# Patient Record
Sex: Female | Born: 1970 | Race: Black or African American | Hispanic: No | Marital: Single | Smoking: Never smoker
Health system: Southern US, Community
[De-identification: ages and names within clinical notes are randomized; demographics above are authoritative.]

## PROBLEM LIST (undated history)

## (undated) DIAGNOSIS — I10 Essential (primary) hypertension: Secondary | ICD-10-CM

## (undated) DIAGNOSIS — L309 Dermatitis, unspecified: Secondary | ICD-10-CM

## (undated) DIAGNOSIS — J302 Other seasonal allergic rhinitis: Secondary | ICD-10-CM

## (undated) DIAGNOSIS — M79606 Pain in leg, unspecified: Secondary | ICD-10-CM

## (undated) DIAGNOSIS — K219 Gastro-esophageal reflux disease without esophagitis: Secondary | ICD-10-CM

## (undated) DIAGNOSIS — G44229 Chronic tension-type headache, not intractable: Secondary | ICD-10-CM

## (undated) HISTORY — DX: Other seasonal allergic rhinitis: J30.2

## (undated) HISTORY — DX: Pain in leg, unspecified: M79.606

## (undated) HISTORY — DX: Gastro-esophageal reflux disease without esophagitis: K21.9

## (undated) HISTORY — PX: TUBAL LIGATION: SHX77

## (undated) HISTORY — PX: BLADDER SURGERY: SHX569

## (undated) HISTORY — DX: Dermatitis, unspecified: L30.9

## (undated) HISTORY — DX: Essential (primary) hypertension: I10

## (undated) HISTORY — DX: Chronic tension-type headache, not intractable: G44.229

---

## 2003-07-16 ENCOUNTER — Inpatient Hospital Stay (HOSPITAL_COMMUNITY): Admission: AD | Admit: 2003-07-16 | Discharge: 2003-07-16 | Payer: Self-pay | Admitting: Obstetrics & Gynecology

## 2004-04-14 ENCOUNTER — Emergency Department: Payer: Self-pay | Admitting: Internal Medicine

## 2004-06-14 ENCOUNTER — Emergency Department: Payer: Self-pay | Admitting: Emergency Medicine

## 2005-06-08 ENCOUNTER — Emergency Department: Payer: Self-pay | Admitting: Emergency Medicine

## 2007-05-20 ENCOUNTER — Emergency Department: Payer: Self-pay | Admitting: Emergency Medicine

## 2010-05-10 ENCOUNTER — Ambulatory Visit: Payer: Self-pay | Admitting: Unknown Physician Specialty

## 2010-05-18 ENCOUNTER — Ambulatory Visit: Payer: Self-pay | Admitting: Unknown Physician Specialty

## 2010-06-01 ENCOUNTER — Ambulatory Visit: Payer: Self-pay | Admitting: Unknown Physician Specialty

## 2011-04-04 ENCOUNTER — Ambulatory Visit: Payer: Self-pay | Admitting: Urology

## 2012-04-04 ENCOUNTER — Ambulatory Visit: Payer: Self-pay | Admitting: Family Medicine

## 2012-05-01 ENCOUNTER — Ambulatory Visit: Payer: Self-pay | Admitting: Otolaryngology

## 2013-04-30 ENCOUNTER — Other Ambulatory Visit (HOSPITAL_COMMUNITY): Payer: Self-pay | Admitting: Orthopedic Surgery

## 2013-04-30 DIAGNOSIS — M545 Low back pain, unspecified: Secondary | ICD-10-CM

## 2013-05-02 ENCOUNTER — Ambulatory Visit (HOSPITAL_COMMUNITY)
Admission: RE | Admit: 2013-05-02 | Discharge: 2013-05-02 | Disposition: A | Discharge status: Home / Self Care | Payer: BC Managed Care – PPO | Source: Ambulatory Visit | Attending: Orthopedic Surgery | Admitting: Orthopedic Surgery

## 2013-05-02 ENCOUNTER — Encounter (HOSPITAL_COMMUNITY): Payer: Self-pay

## 2013-05-02 DIAGNOSIS — M545 Low back pain, unspecified: Secondary | ICD-10-CM

## 2013-05-02 DIAGNOSIS — Q760 Spina bifida occulta: Secondary | ICD-10-CM | POA: Insufficient documentation

## 2013-05-02 DIAGNOSIS — M47817 Spondylosis without myelopathy or radiculopathy, lumbosacral region: Secondary | ICD-10-CM | POA: Insufficient documentation

## 2013-05-13 ENCOUNTER — Ambulatory Visit: Payer: Self-pay | Admitting: Gastroenterology

## 2013-05-15 LAB — PATHOLOGY REPORT

## 2013-05-20 ENCOUNTER — Ambulatory Visit: Payer: Self-pay | Admitting: Gastroenterology

## 2013-07-17 DIAGNOSIS — R350 Frequency of micturition: Secondary | ICD-10-CM | POA: Insufficient documentation

## 2013-07-17 DIAGNOSIS — R339 Retention of urine, unspecified: Secondary | ICD-10-CM | POA: Insufficient documentation

## 2013-07-17 DIAGNOSIS — N3020 Other chronic cystitis without hematuria: Secondary | ICD-10-CM | POA: Insufficient documentation

## 2013-09-02 ENCOUNTER — Ambulatory Visit: Payer: Self-pay | Admitting: Gastroenterology

## 2013-09-11 DIAGNOSIS — K219 Gastro-esophageal reflux disease without esophagitis: Secondary | ICD-10-CM | POA: Insufficient documentation

## 2014-01-08 DIAGNOSIS — G44229 Chronic tension-type headache, not intractable: Secondary | ICD-10-CM | POA: Insufficient documentation

## 2014-01-08 DIAGNOSIS — M79606 Pain in leg, unspecified: Secondary | ICD-10-CM | POA: Insufficient documentation

## 2014-02-04 DIAGNOSIS — R7302 Impaired glucose tolerance (oral): Secondary | ICD-10-CM | POA: Insufficient documentation

## 2014-06-26 ENCOUNTER — Ambulatory Visit: Payer: Self-pay | Admitting: Podiatry

## 2016-03-09 DIAGNOSIS — T85192A Other mechanical complication of implanted electronic neurostimulator (electrode) of spinal cord, initial encounter: Secondary | ICD-10-CM | POA: Insufficient documentation

## 2016-10-26 ENCOUNTER — Other Ambulatory Visit: Payer: Self-pay | Admitting: Orthopedic Surgery

## 2016-10-26 DIAGNOSIS — M4800 Spinal stenosis, site unspecified: Secondary | ICD-10-CM

## 2016-10-27 ENCOUNTER — Ambulatory Visit
Admission: RE | Admit: 2016-10-27 | Discharge: 2016-10-27 | Disposition: A | Discharge status: Home / Self Care | Payer: Managed Care, Other (non HMO) | Source: Ambulatory Visit | Attending: Orthopedic Surgery | Admitting: Orthopedic Surgery

## 2016-10-27 DIAGNOSIS — M4800 Spinal stenosis, site unspecified: Secondary | ICD-10-CM

## 2017-01-11 ENCOUNTER — Other Ambulatory Visit: Payer: Self-pay | Admitting: Orthopedic Surgery

## 2017-01-11 DIAGNOSIS — M533 Sacrococcygeal disorders, not elsewhere classified: Secondary | ICD-10-CM

## 2017-01-23 ENCOUNTER — Ambulatory Visit
Admission: RE | Admit: 2017-01-23 | Discharge: 2017-01-23 | Disposition: A | Discharge status: Home / Self Care | Payer: Managed Care, Other (non HMO) | Source: Ambulatory Visit | Attending: Orthopedic Surgery | Admitting: Orthopedic Surgery

## 2017-01-23 DIAGNOSIS — M533 Sacrococcygeal disorders, not elsewhere classified: Secondary | ICD-10-CM

## 2017-02-07 ENCOUNTER — Other Ambulatory Visit: Payer: Self-pay | Admitting: Orthopedic Surgery

## 2017-02-07 DIAGNOSIS — M533 Sacrococcygeal disorders, not elsewhere classified: Secondary | ICD-10-CM

## 2017-02-14 ENCOUNTER — Ambulatory Visit (INDEPENDENT_AMBULATORY_CARE_PROVIDER_SITE_OTHER): Payer: Managed Care, Other (non HMO) | Admitting: Obstetrics and Gynecology

## 2017-02-14 ENCOUNTER — Encounter: Payer: Self-pay | Admitting: Obstetrics and Gynecology

## 2017-02-14 VITALS — BP 131/84 | HR 99 | Ht 66.0 in | Wt 243.2 lb

## 2017-02-14 DIAGNOSIS — E669 Obesity, unspecified: Secondary | ICD-10-CM

## 2017-02-14 MED ORDER — PHENTERMINE HCL 37.5 MG PO TABS: 37.5000 mg | ORAL_TABLET | Freq: Every day | ORAL | 2 refills | Status: AC

## 2017-02-14 MED ORDER — CYANOCOBALAMIN 1000 MCG/ML IJ SOLN: 1000.0000 ug | INTRAMUSCULAR | 1 refills | Status: AC

## 2017-02-14 NOTE — Patient Instructions (Signed)
1. High-Mercury Fish Mercury is a highly toxic element. It has no known safe level of exposure and is most commonly found in polluted water (1).  In higher amounts, it can be toxic to your nervous system, immune system and kidneys. It may also cause serious developmental problems in children (2).  Since it's found in polluted seas, large marine fish can accumulate high amounts of mercury.  Therefore, pregnant women are advised to limit their consumption of high-mercury fish to no more than 1-2 servings per month (3, 4).  High-mercury fish include:  Shark Swordfish Norfolk SouthernKing mackerel Tuna (especially albacore tuna) However, it's important to note that not all fish are high in mercury - just certain types.  Consuming low-mercury fish during pregnancy is very healthy, and these fish can be eaten up to 2 times per week. Fatty fish is high in omega-3 fatty acids, which are important for your baby.  SUMMARY Pregnant women should not eat high-mercury fish more than 1-2 times each month. This includes shark, swordfish, tuna and mackerel. 2. Undercooked or Raw Fish Raw fish, especially shellfish, can cause several infections. These can be viral, bacterial or parasitic, such as norovirus, Vibrio, Salmonella and Listeria (5, 6, 7).  Some of these infections only affect the mother, leaving her dehydrated and weak. Other infections may be passed on to the unborn baby with serious, or even fatal, consequences (5, 6).  Pregnant women are especially susceptible to Listeria infections. In fact, pregnant women are up to 20 times more likely to get infected by Listeria than the general population (8).  This bacteria can be found in soil and contaminated water or plants. Raw fish can become infected during processing, including smoking or drying.  Listeria can be passed to an unborn baby through the placenta, even if the mother is not showing any signs of illness. This can lead to premature delivery,  miscarriage, stillbirth and other serious health problems (9).  Pregnant women are therefore advised to avoid raw fish and shellfish. This includes many sushi dishes.  SUMMARY Raw fish and shellfish can be contaminated with bacteria and parasites. Some of these can cause adverse health effects and harm both the mother and unborn baby. [employer] Get Free Baby Stuff! Join [employer] to get free samples, coupons, offers and more from top brands such as [employer], [employer], [employer], [employer] and more!  Enter your due date or birthdate         3. Undercooked, Raw and Processed Meat Eating undercooked or raw meat increases your risk of infection from several bacteria or parasites, including Toxoplasma, E. coli, Listeria and Salmonella (10, 11, 12, 13).  Bacteria may threaten the health of your unborn baby, possibly leading to stillbirth or severe neurological illnesses, including mental retardation, blindness and epilepsy (14).  While most bacteria are found on the surface of whole pieces of meat, other bacteria may linger inside the muscle fibers.  Some whole cuts of meat - such as tenderloins, sirloins or ribeye from beef, lamb and veal - may be safe to consume when not cooked all the way through.  However, this only applies when the piece of meat is whole or uncut, and completely cooked on the outside.  Cut meat, including meat patties, burgers, minced meat, pork and poultry, should never be consumed raw or undercooked.  Hot dogs, lunch meat and deli meat are also of concern. These types of meat may become infected with various bacteria during processing or storage.  Pregnant women should not  consume processed meat products unless they've been reheated until steaming hot.  SUMMARY Raw or undercooked meat may contain harmful bacteria. As a general rule, meat should be cooked all the way through. 4. Raw Eggs Raw eggs can be contaminated with Salmonella.  Symptoms of Salmonella  infections are usually experienced only by the mother and include fever, nausea, vomiting, stomach cramps and diarrhea (15, 16).  However, in rare cases, the infection may cause cramps in the uterus, leading to premature birth or stillbirth (17).  Foods that commonly contain raw eggs include:  Lightly scrambled eggs Poached eggs Hollandaise sauce Homemade mayonnaise Salad dressings Homemade ice cream Cake icings Most commercial products that contain raw eggs are made with pasteurized eggs and are safe to consume. However, you should always read the label to make sure.  Pregnant women should always cook eggs thoroughly or use pasteurized eggs.  SUMMARY Raw eggs may be contaminated with Salmonella, which can lead to sickness and an increased risk of premature birth or stillbirth. Pasteurized eggs can be used instead. 5. Organ Meat Organ meat is a great source of several nutrients.  These include iron, vitamin B12, vitamin A and copper - all of which are good for an expectant mother and her child.  However, eating too much animal-based vitamin A (preformed vitamin A) is not recommended during pregnancy.  It may cause vitamin A toxicity, as well as abnormally high copper levels, which can result in birth defects and liver toxicity (18, 19, 20).  Therefore, pregnant women should not eat organ meat more than once a week.  SUMMARY Organ meat is a great source of iron, vitamin B12, vitamin A and copper. To prevent vitamin A and copper toxicity, pregnant women are advised to limit their intake of organ meat to no more than once a week. 6. Caffeine Caffeine is the most commonly used psychoactive substance in the world and mainly found in coffee, tea, soft drinks and cocoa (21, 22).  Pregnant women are generally advised to limit their caffeine intake to less than 200 mg per day, or about 2-3 cups of coffee.  Caffeine is absorbed very quickly and passes easily into the placenta and  fetus.  Because unborn babies and their placentas don't have the main enzyme needed to metabolize caffeine, high levels can build up (23, 24, 25).  High caffeine intake during pregnancy has been shown to restrict fetal growth and increase the risk of low birth weight at delivery (26).  Low birth weight - defined as less than 5 lbs, 8 oz (or 2.5 kg) - is associated with an increased risk of infant death and a higher risk of chronic diseases in adulthood, such as type 2 diabetes and heart disease (27, 28).  SUMMARY Pregnant women should limit their caffeine intake to 200 mg per day, which is about 2-3 cups of coffee. High caffeine intake during pregnancy can limit fetal growth and cause low birth weight. 7. Raw Sprouts Raw sprouts, including alfalfa, clover, radish and mung bean sprouts, may be contaminated with Salmonella (29).  The humid environment required by seeds to start sprouting is ideal for these kinds of bacteria, and they're almost impossible to wash off.  For this reason, pregnant women are advised to avoid raw sprouts altogether. However, sprouts are safe to consume after they have been cooked (30).  SUMMARY Raw sprouts may be contaminated with bacteria inside the seeds. Pregnant women should only eat cooked sprouts. 8. Unwashed Produce The surface of unwashed or unpeeled  fruits and vegetables may be contaminated with several bacteria and parasites (31).  These include Toxoplasma, E. coli, Salmonella and Listeria, which can be acquired from the soil or through handling.  Contamination can occur at any time during production, harvest, processing, storage, transportation or retail (29).  Bacteria can harm both the mother and her unborn baby. One very dangerous parasite that may linger on fruits and vegetables is called Toxoplasma.  The majority of people who get Toxoplasmosis have no symptoms, while others may feel like they have the flu for a month or more.  Most infants who  are infected with Toxoplasma while still in the womb have no symptoms at birth. However, symptoms such as blindness or intellectual disabilities may develop later in life.  What's more, a small percentage of infected newborns have serious eye or brain damage at birth.  While you're pregnant, it's very important to minimize the risk of infection by thoroughly rinsing, peeling or cooking fruits and vegetables (29).  SUMMARY Fruits and vegetables may be contaminated with harmful bacteria, including Toxoplasma. It's important to thoroughly rinse all fruits and vegetables. 9. Unpasteurized Milk, Cheese and Fruit Juice Raw milk and unpasteurized cheese can contain an array of harmful bacteria, including Listeria, Salmonella, E. coli and Campylobacter.  The same goes for unpasteurized juice, which is also prone to bacterial contamination.  These infections can all have life-threatening consequences for an unborn baby (32, 71, 34, 35, 36).  The bacteria can be naturally occurring or caused by contamination during collection or storage (36, 37).  Pasteurization is the most effective way to kill any harmful bacteria, without changing the nutritional value of the products (38).  To minimize the risk of infections, pregnant women are advised to consume only pasteurized milk, cheese and fruit juice.  SUMMARY Pregnant women should not consume unpasteurized milk, cheese or fruit juice, as these foods increase the risk of bacterial infections. 10. Alcohol Pregnant women are advised to completely avoid drinking alcohol, as it increases the risk of miscarriage and stillbirth. Even a small amount can negatively impact your baby's brain development (39, 40, 41, 42).  It can also cause fetal alcohol syndrome, which involves facial deformities, heart defects and mental retardation (43, 44).  Since no level of alcohol has been proven to be safe during pregnancy, it's recommended to avoid it  altogether.  SUMMARY Pregnant women should not drink alcohol. Drinking alcohol can increase the risk of miscarriage, stillbirth and fetal alcohol syndrome. 11. Processed Junk Foods Pregnancy is a time of rapid growth.  As a result, pregnant women need increased amounts of many essential nutrients, including protein, folate and iron.  Yet even though you're essentially eating for two, you don't need twice the calories - about 350-500 extra calories per day during the second and third trimesters should be enough (45).  An optimal pregnancy diet should mainly consist of whole foods, with plenty of nutrients to fulfill the needs of the mother and growing child.  Processed junk food is generally low in nutrients and high in calories, sugar and added fats.  What's more, added sugar has been linked to a dramatically increased risk of developing several diseases, including type 2 diabetes and heart disease (46, 47).  While some weight gain is necessary during pregnancy, excess weight gain has been linked to many complications and diseases.  These include an increased risk of gestational diabetes, as well as pregnancy or birth complications. It can also increase your risk of having an overweight child (  48, 49).  This causes long-term health issues since overweight children are much more likely to become overweight adults (50, 51, 52).  SUMMARY Eating processed foods during pregnancy can increase your risk of excess weight gain, gestational diabetes and complications. This can have long-term health implications for your child. [employer] Proper food hygiene and preparation is always recommended, especially during pregnancy.  However, this is not always easy to do, since some foods may already be contaminated when you purchase them.  For this reason, it's best to avoid the foods on this list as much as possible. Your health and that of your unborn child should come first.

## 2017-02-14 NOTE — Progress Notes (Signed)
Subjective:  Rebekah Garrett is a 46 y.o. G2P2000 at Unknown being seen today for weight loss management- initial visit.  Patient reports General ROS: negative and reports previous weight loss attempts:   Previous treatment includes: small frequent feedings, nutritional supplement,  vitamin B-12 injections and appetite suppresant.  Pertinent medical history includes:hypetension and GERD  The patient has a surgical history of: Sinus surgery,     The following portions of the patient's history were reviewed and updated as appropriate: allergies, current medications, past family history, past medical history, past social history, past surgical history and problem list.   Objective:   Vitals:   02/14/17 0955  BP: 131/84  Pulse: 99  Weight: 243 lb 3.2 oz (110.3 kg)  Height: 5\' 6"  (1.676 m)    General:  Alert, oriented and cooperative. Patient is in no acute distress.  :   :   :   :   :   :   PE: Well groomed female in no current distress,   Mental Status: Normal mood and affect. Normal behavior. Normal judgment and thought content.   Current BMI: Body mass index is 39.25 kg/m.   Assessment and Plan:  Obesity  There are no diagnoses linked to this encounter.  Plan: low carb, High protein diet RX for adipex 37.5 mg daily and B12 .ml monthly, to start now with first injection given at today's visit. Reviewed side-effects common to both medications and expected outcomes. Increase daily water intake to at least 8 bottle a day, every day.  Goal is to reduse weight by 10% by end of three months, and will re-evaluate then.  RTC in 4 weeks for Nurse visit to check weight & BP, and get next B12 injections.    Please refer to After Visit Summary for other counseling recommendations.    Brushton, Melody N, CNM   Melody [employer], CNM      1. High-Mercury Fish Mercury is a highly toxic element. It has no known safe level of exposure and is most commonly found  in polluted water (1).  In higher amounts, it can be toxic to your nervous system, immune system and kidneys. It may also cause serious developmental problems in children (2).  Since it's found in polluted seas, large marine fish can accumulate high amounts of mercury.  Therefore, pregnant women are advised to limit their consumption of high-mercury fish to no more than 1-2 servings per month (3, 4).  High-mercury fish include:  Shark Swordfish [employer] (especially albacore tuna) However, it's important to note that not all fish are high in mercury - just certain types.  Consuming low-mercury fish during pregnancy is very healthy, and these fish can be eaten up to 2 times per week. Fatty fish is high in omega-3 fatty acids, which are important for your baby.  SUMMARY Pregnant women should not eat high-mercury fish more than 1-2 times each month. This includes shark, swordfish, tuna and mackerel. 2. Undercooked or Raw Fish Raw fish, especially shellfish, can cause several infections. These can be viral, bacterial or parasitic, such as norovirus, Vibrio, Salmonella and Listeria (5, 6, 7).  Some of these infections only affect the mother, leaving her dehydrated and weak. Other infections may be passed on to the unborn baby with serious, or even fatal, consequences (5, 6).  Pregnant women are especially susceptible to Listeria infections. In fact, pregnant women are up to 20 times more likely to get infected by Listeria than the general population (  8).  This bacteria can be found in soil and contaminated water or plants. Raw fish can become infected during processing, including smoking or drying.  Listeria can be passed to an unborn baby through the placenta, even if the mother is not showing any signs of illness. This can lead to premature delivery, miscarriage, stillbirth and other serious health problems (9).  Pregnant women are therefore advised to avoid raw fish and  shellfish. This includes many sushi dishes.  SUMMARY Raw fish and shellfish can be contaminated with bacteria and parasites. Some of these can cause adverse health effects and harm both the mother and unborn baby. EVERYDAYF[employer]ILY Get Free Baby Stuff! Join EverydayFamily to get free samples, coupons, offers and more from top brands such as [employer], [employer]ilac, Huggies, [employer] and more!  Enter your due date or birthdate         3. Undercooked, Raw and Processed Meat Eating undercooked or raw meat increases your risk of infection from several bacteria or parasites, including Toxoplasma, E. coli, Listeria and Salmonella (10, 11, 12, 13).  Bacteria may threaten the health of your unborn baby, possibly leading to stillbirth or severe neurological illnesses, including mental retardation, blindness and epilepsy (14).  While most bacteria are found on the surface of whole pieces of meat, other bacteria may linger inside the muscle fibers.  Some whole cuts of meat - such as tenderloins, sirloins or ribeye from beef, lamb and veal - may be safe to consume when not cooked all the way through.  However, this only applies when the piece of meat is whole or uncut, and completely cooked on the outside.  Cut meat, including meat patties, burgers, minced meat, pork and poultry, should never be consumed raw or undercooked.  Hot dogs, lunch meat and deli meat are also of concern. These types of meat may become infected with various bacteria during processing or storage.  Pregnant women should not consume processed meat products unless they've been reheated until steaming hot.  SUMMARY Raw or undercooked meat may contain harmful bacteria. As a general rule, meat should be cooked all the way through. 4. Raw Eggs Raw eggs can be contaminated with Salmonella.  Symptoms of Salmonella infections are usually experienced only by the mother and include fever, nausea, vomiting, stomach cramps and diarrhea (15,  16).  However, in rare cases, the infection may cause cramps in the uterus, leading to premature birth or stillbirth (17).  Foods that commonly contain raw eggs include:  Lightly scrambled eggs Poached eggs Hollandaise sauce Homemade mayonnaise Salad dressings Homemade ice cream Cake icings Most commercial products that contain raw eggs are made with pasteurized eggs and are safe to consume. However, you should always read the label to make sure.  Pregnant women should always cook eggs thoroughly or use pasteurized eggs.  SUMMARY Raw eggs may be contaminated with Salmonella, which can lead to sickness and an increased risk of premature birth or stillbirth. Pasteurized eggs can be used instead. 5. Organ Meat Organ meat is a great source of several nutrients.  These include iron, vitamin B12, vitamin A and copper - all of which are good for an expectant mother and her child.  However, eating too much animal-based vitamin A (preformed vitamin A) is not recommended during pregnancy.  It may cause vitamin A toxicity, as well as abnormally high copper levels, which can result in birth defects and liver toxicity (18, 19, 20).  Therefore, pregnant women should not eat organ meat more than once  a week.  SUMMARY Organ meat is a great source of iron, vitamin B12, vitamin A and copper. To prevent vitamin A and copper toxicity, pregnant women are advised to limit their intake of organ meat to no more than once a week. 6. Caffeine Caffeine is the most commonly used psychoactive substance in the world and mainly found in coffee, tea, soft drinks and cocoa (21, 22).  Pregnant women are generally advised to limit their caffeine intake to less than 200 mg per day, or about 2-3 cups of coffee.  Caffeine is absorbed very quickly and passes easily into the placenta and fetus.  Because unborn babies and their placentas don't have the main enzyme needed to metabolize caffeine, high levels can build  up (23, 24, 25).  High caffeine intake during pregnancy has been shown to restrict fetal growth and increase the risk of low birth weight at delivery (26).  Low birth weight - defined as less than 5 lbs, 8 oz (or 2.5 kg) - is associated with an increased risk of infant death and a higher risk of chronic diseases in adulthood, such as type 2 diabetes and heart disease (27, 28).  SUMMARY Pregnant women should limit their caffeine intake to 200 mg per day, which is about 2-3 cups of coffee. High caffeine intake during pregnancy can limit fetal growth and cause low birth weight. 7. Raw Sprouts Raw sprouts, including alfalfa, clover, radish and mung bean sprouts, may be contaminated with Salmonella (29).  The humid environment required by seeds to start sprouting is ideal for these kinds of bacteria, and they're almost impossible to wash off.  For this reason, pregnant women are advised to avoid raw sprouts altogether. However, sprouts are safe to consume after they have been cooked (30).  SUMMARY Raw sprouts may be contaminated with bacteria inside the seeds. Pregnant women should only eat cooked sprouts. 8. Unwashed Produce The surface of unwashed or unpeeled fruits and vegetables may be contaminated with several bacteria and parasites (31).  These include Toxoplasma, E. coli, Salmonella and Listeria, which can be acquired from the soil or through handling.  Contamination can occur at any time during production, harvest, processing, storage, transportation or retail (29).  Bacteria can harm both the mother and her unborn baby. One very dangerous parasite that may linger on fruits and vegetables is called Toxoplasma.  The majority of people who get Toxoplasmosis have no symptoms, while others may feel like they have the flu for a month or more.  Most infants who are infected with Toxoplasma while still in the womb have no symptoms at birth. However, symptoms such as blindness or intellectual  disabilities may develop later in life.  What's more, a small percentage of infected newborns have serious eye or brain damage at birth.  While you're pregnant, it's very important to minimize the risk of infection by thoroughly rinsing, peeling or cooking fruits and vegetables (29).  SUMMARY Fruits and vegetables may be contaminated with harmful bacteria, including Toxoplasma. It's important to thoroughly rinse all fruits and vegetables. 9. Unpasteurized Milk, Cheese and Fruit Juice Raw milk and unpasteurized cheese can contain an array of harmful bacteria, including Listeria, Salmonella, E. coli and Campylobacter.  The same goes for unpasteurized juice, which is also prone to bacterial contamination.  These infections can all have life-threatening consequences for an unborn baby (32, 2633, 34, 35, 36).  The bacteria can be naturally occurring or caused by contamination during collection or storage (36, 37).  Pasteurization is the  most effective way to kill any harmful bacteria, without changing the nutritional value of the products (38).  To minimize the risk of infections, pregnant women are advised to consume only pasteurized milk, cheese and fruit juice.  SUMMARY Pregnant women should not consume unpasteurized milk, cheese or fruit juice, as these foods increase the risk of bacterial infections. 10. Alcohol Pregnant women are advised to completely avoid drinking alcohol, as it increases the risk of miscarriage and stillbirth. Even a small amount can negatively impact your baby's brain development (39, 40, 41, 42).  It can also cause fetal alcohol syndrome, which involves facial deformities, heart defects and mental retardation (43, 44).  Since no level of alcohol has been proven to be safe during pregnancy, it's recommended to avoid it altogether.  SUMMARY Pregnant women should not drink alcohol. Drinking alcohol can increase the risk of miscarriage, stillbirth and fetal alcohol  syndrome. 11. Processed Junk Foods Pregnancy is a time of rapid growth.  As a result, pregnant women need increased amounts of many essential nutrients, including protein, folate and iron.  Yet even though you're essentially eating for two, you don't need twice the calories - about 350-500 extra calories per day during the second and third trimesters should be enough (45).  An optimal pregnancy diet should mainly consist of whole foods, with plenty of nutrients to fulfill the needs of the mother and growing child.  Processed junk food is generally low in nutrients and high in calories, sugar and added fats.  What's more, added sugar has been linked to a dramatically increased risk of developing several diseases, including type 2 diabetes and heart disease (46, 47).  While some weight gain is necessary during pregnancy, excess weight gain has been linked to many complications and diseases.  These include an increased risk of gestational diabetes, as well as pregnancy or birth complications. It can also increase your risk of having an overweight child (48, 49).  This causes long-term health issues since overweight children are much more likely to become overweight adults (50, 51, 52).  SUMMARY Eating processed foods during pregnancy can increase your risk of excess weight gain, gestational diabetes and complications. This can have long-term health implications for your child. [employer] Proper food hygiene and preparation is always recommended, especially during pregnancy.  However, this is not always easy to do, since some foods may already be contaminated when you purchase them.  For this reason, it's best to avoid the foods on this list as much as possible. Your health and that of your unborn child should come first.

## 2017-02-15 ENCOUNTER — Other Ambulatory Visit: Payer: Managed Care, Other (non HMO)

## 2017-02-15 ENCOUNTER — Inpatient Hospital Stay
Admission: RE | Admit: 2017-02-15 | Discharge: 2017-02-15 | Disposition: A | Discharge status: Home / Self Care | Payer: Managed Care, Other (non HMO) | Source: Ambulatory Visit | Attending: Orthopedic Surgery | Admitting: Orthopedic Surgery

## 2017-02-23 ENCOUNTER — Ambulatory Visit
Admission: RE | Admit: 2017-02-23 | Discharge: 2017-02-23 | Disposition: A | Discharge status: Home / Self Care | Payer: Managed Care, Other (non HMO) | Source: Ambulatory Visit | Attending: Orthopedic Surgery | Admitting: Orthopedic Surgery

## 2017-02-23 DIAGNOSIS — M533 Sacrococcygeal disorders, not elsewhere classified: Secondary | ICD-10-CM

## 2017-02-23 MED ORDER — METHYLPREDNISOLONE ACETATE 40 MG/ML INJ SUSP (RADIOLOG: 120.0000 mg | Freq: Once | INTRAMUSCULAR | Status: DC

## 2017-03-12 ENCOUNTER — Encounter: Payer: Managed Care, Other (non HMO) | Admitting: Obstetrics and Gynecology

## 2017-03-19 ENCOUNTER — Ambulatory Visit (INDEPENDENT_AMBULATORY_CARE_PROVIDER_SITE_OTHER): Payer: Managed Care, Other (non HMO) | Admitting: Obstetrics and Gynecology

## 2017-03-19 ENCOUNTER — Encounter: Payer: Self-pay | Admitting: Obstetrics and Gynecology

## 2017-03-19 ENCOUNTER — Encounter: Payer: Managed Care, Other (non HMO) | Admitting: Obstetrics and Gynecology

## 2017-03-19 VITALS — BP 129/79 | HR 91 | Ht 66.0 in | Wt 235.0 lb

## 2017-03-19 DIAGNOSIS — Z79899 Other long term (current) drug therapy: Secondary | ICD-10-CM | POA: Diagnosis not present

## 2017-03-19 DIAGNOSIS — E669 Obesity, unspecified: Secondary | ICD-10-CM

## 2017-03-19 MED ORDER — CYANOCOBALAMIN 1000 MCG/ML IJ SOLN
1000.0000 ug | Freq: Once | INTRAMUSCULAR | Status: AC
  Administered 2017-03-19: 1000 ug via INTRAMUSCULAR

## 2017-03-19 NOTE — Progress Notes (Signed)
Pt presents for 4 week f/u wt bp and b12. Weight is down 8#. No side effects from medication. Pt is exercising 3 x a week. Encouraged to eat healthy. Follow up in 4 weeks.

## 2017-04-16 ENCOUNTER — Encounter: Payer: Managed Care, Other (non HMO) | Admitting: Obstetrics and Gynecology

## 2017-06-10 ENCOUNTER — Other Ambulatory Visit: Payer: Self-pay

## 2017-06-10 ENCOUNTER — Emergency Department
Admission: EM | Admit: 2017-06-10 | Discharge: 2017-06-10 | Disposition: A | Discharge status: Home / Self Care | Payer: Worker's Compensation | Attending: Student in an Organized Health Care Education/Training Program | Admitting: Student in an Organized Health Care Education/Training Program

## 2017-06-10 ENCOUNTER — Emergency Department: Payer: Worker's Compensation

## 2017-06-10 DIAGNOSIS — Z79899 Other long term (current) drug therapy: Secondary | ICD-10-CM | POA: Insufficient documentation

## 2017-06-10 DIAGNOSIS — I10 Essential (primary) hypertension: Secondary | ICD-10-CM | POA: Insufficient documentation

## 2017-06-10 DIAGNOSIS — M25562 Pain in left knee: Secondary | ICD-10-CM | POA: Diagnosis not present

## 2017-06-10 DIAGNOSIS — W010XXA Fall on same level from slipping, tripping and stumbling without subsequent striking against object, initial encounter: Secondary | ICD-10-CM | POA: Diagnosis not present

## 2017-06-10 DIAGNOSIS — M549 Dorsalgia, unspecified: Secondary | ICD-10-CM | POA: Insufficient documentation

## 2017-06-10 MED ORDER — MELOXICAM 15 MG PO TABS: 15.0000 mg | ORAL_TABLET | Freq: Every day | ORAL | 1 refills | Status: AC

## 2017-06-10 MED ORDER — TRAMADOL HCL 50 MG PO TABS
50.0000 mg | ORAL_TABLET | Freq: Four times a day (QID) | ORAL | 0 refills | Status: AC | PRN
Start: 2017-06-10 — End: 2017-06-13

## 2017-06-10 NOTE — ED Notes (Signed)
Patient is still in the department for worker's comp. Procedures.

## 2017-06-10 NOTE — ED Notes (Signed)
Came to rm 44 to do a workmans

## 2017-06-10 NOTE — ED Provider Notes (Signed)
[facility] Emergency Department Provider Note  ____________________________________________  Time seen: Approximately 9:44 PM  I have reviewed the triage vital signs and the nursing notes.   HISTORY  Chief Complaint Fall; Knee Injury; and Back Pain    HPI Rebekah Garrett is a 47 y.o. 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  Patient denies weakness, radiculopathy or changes in sensation of the lower extremities.  She was able to ambulate after incident.  She did not hit her head or lose consciousness.  Pain does not radiate no medications were attempted prior to presenting to the emergency department.  Past Medical History:  Diagnosis Date  . Chronic tension headache   . Eczema   . GERD (gastroesophageal reflux disease)   . Hypertension   . Leg pain   . Seasonal allergies     There are no active problems to display for this patient.   Past Surgical History:  Procedure Laterality Date  . BLADDER SURGERY    . TUBAL LIGATION      Prior to Admission medications   Medication Sig Start Date End Date Taking? Authorizing Provider  amitriptyline (ELAVIL) 50 MG tablet Take 50 mg by mouth at bedtime.    [provider]  cyanocobalamin (,VITAMIN B-12,) 1000 MCG/ML injection Inject 1 mL (1,000 mcg total) into the muscle every 30 (thirty) days. 02/14/17   Shambley, Melody N, CNM  cyclobenzaprine (FLEXERIL) 10 MG tablet Take 10 mg by mouth at bedtime.    [provider]  dexlansoprazole (DEXILANT) 60 MG capsule Take 60 mg by mouth daily.    [provider]  losartan-hydrochlorothiazide (HYZAAR) 50-12.5 MG tablet Take 1 tablet by mouth daily.    [provider]  montelukast (SINGULAIR) 10 MG tablet Take 10 mg by mouth at bedtime.    [provider]  phentermine (ADIPEX-P) 37.5 MG tablet  Take 1 tablet (37.5 mg total) by mouth daily before breakfast. 02/14/17   Shambley, Melody N, CNM  tolterodine (DETROL LA) 4 MG 24 hr capsule Take 4 mg by mouth daily.    [provider]  traMADol (ULTRAM) 50 MG tablet Take 1 tablet (50 mg total) by mouth every 6 (six) hours as needed for up to 3 days. 06/10/17 06/13/17  Orvil Feil, PA-C    Allergies Sulfa antibiotics  No family history on file.  Social History Social History   Tobacco Use  . Smoking status: Never Smoker  . Smokeless tobacco: Never Used  Substance Use Topics  . Alcohol use: No  . Drug use: No     Review of Systems  Constitutional: No fever/chills Eyes: No visual changes. No discharge ENT: No upper respiratory complaints. Cardiovascular: no chest pain. Respiratory: no cough. No SOB. Musculoskeletal: Patient has left knee pain.  Skin: Negative for rash, abrasions, lacerations, ecchymosis. Neurological: Negative for headaches, focal weakness or numbness.   ____________________________________________   PHYSICAL EXAM:  VITAL SIGNS: ED Triage Vitals  Enc Vitals Group     BP 06/10/17 1941 138/87     Pulse Rate 06/10/17 1941 87     Resp 06/10/17 1941 16     Temp 06/10/17 1941 98.4 F (36.9 C)     Temp Source 06/10/17 1941 Oral     SpO2 06/10/17 1941 100 %     Weight 06/10/17 1942 230 lb (104.3 kg)  Height 06/10/17 1942 5\' 6"  (1.676 m)     Head Circumference --      Peak Flow --      Pain Score 06/10/17 1942 4     Pain Loc --      Pain Edu? --      Excl. in GC? --      Constitutional: Alert and oriented. Well appearing and in no acute distress. Eyes: Conjunctivae are normal. PERRL. EOMI. Head: Atraumatic. Cardiovascular: Normal rate, regular rhythm. Normal S1 and S2.  Good peripheral circulation. Respiratory: Normal respiratory effort without tachypnea or retractions. Lungs CTAB. Good air entry to the bases with no decreased or absent breath sounds. Musculoskeletal: Patient is  able to perform full range of motion at the left knee. Left knee: No popliteal fullness.  No laxity with LCL or MCL testing.  Negative anterior and posterior drawer test.  Negative ballottement.  Negative apprehension.  Palpable dorsalis pedis pulse, left. Neurologic:  Normal speech and language. No gross focal neurologic deficits are appreciated.  Skin:  Skin is warm, dry and intact. No rash noted. Psychiatric: Mood and affect are normal. Speech and behavior are normal. Patient exhibits appropriate insight and judgement.   ____________________________________________   LABS (all labs ordered are listed, but only abnormal results are displayed)  Labs Reviewed - No data to display ____________________________________________  EKG   ____________________________________________  RADIOLOGY Geraldo PitterI, Shatori Bertucci M Corneluis Allston, personally viewed and evaluated these images (plain radiographs) as part of my medical decision making, as well as reviewing the written report by the radiologist.  Dg Knee Complete 4 Views Left  Result Date: 06/10/2017 CLINICAL DATA:  Twisting injury EXAM: LEFT KNEE - COMPLETE 4+ VIEW COMPARISON:  None. FINDINGS: No acute displaced fracture or malalignment is seen. Moderate-to-marked arthritis of the medial compartment and mild to moderate arthritis of the lateral compartment. Trace suprapatellar effusion. IMPRESSION: 1. No acute osseous abnormality 2. Mild to moderate arthritis of the knee with trace suprapatellar effusion Electronically Signed   By: Jasmine PangKim  Fujinaga M.D.   On: 06/10/2017 20:57    ____________________________________________    PROCEDURES  Procedure(s) performed:    Procedures    Medications - No data to display   ____________________________________________   INITIAL IMPRESSION / ASSESSMENT AND PLAN / ED COURSE  Pertinent labs & imaging results that were available during my care of the patient were reviewed by me and considered in my medical decision  making (see chart for details).  Review of the [place] CSRS was performed in accordance of the [facility] prior to dispensing any controlled drugs.     Assessment and plan Left knee pain Patient presents to the emergency department with left knee pain after a fall.  Overall physical exam is reassuring.  Original differential diagnosis included fracture, contusion and ligamentous sprain.  X-ray examination of the knee revealed osteoarthritis but no other acute findings supportive measures were encouraged.  Vital signs were reassuring prior to discharge.  All patient questions were answered.    ____________________________________________  FINAL CLINICAL IMPRESSION(S) / ED DIAGNOSES  Final diagnoses:  Acute pain of left knee      NEW MEDICATIONS STARTED DURING THIS VISIT:  ED Discharge Orders        Ordered    traMADol (ULTRAM) 50 MG tablet  Every 6 hours PRN     06/10/17 2140          This chart was dictated using voice recognition software/Dragon. Despite best efforts to proofread, errors can occur which can change  the meaning. Any change was purely unintentional.    Gasper Lloyd 06/10/17 2148    Willy Eddy, MD 06/10/17 2234

## 2017-06-10 NOTE — ED Notes (Signed)
Came to room 44 to do [employer] workmans comp.  Pt states she can not urinate at this time.  Pt was advised nothing to eat or drink until the physician sees her.  Upon doing the paperwork, pt states she does not have her photo ID on her.  Pt was advised that we cannot do the test without ID.  She states "its in the car".  Pt was advised (she is in a gown at this time) that when she is able to get dressed after treatment, we need the ID to complete the test.  Pt agreed.  Pt was advised not to leave without the test done because it was mandatory for her job.

## 2017-06-10 NOTE — ED Triage Notes (Signed)
Pt states was at work at [employer] when she went to reach for something and she fell on left knee, twisting back as well. Pt is ambulatory without difficulty.

## 2017-07-04 ENCOUNTER — Other Ambulatory Visit: Payer: Self-pay | Admitting: Orthopedic Surgery

## 2017-07-04 DIAGNOSIS — M533 Sacrococcygeal disorders, not elsewhere classified: Secondary | ICD-10-CM

## 2017-07-23 ENCOUNTER — Other Ambulatory Visit: Payer: Self-pay

## 2017-07-24 ENCOUNTER — Ambulatory Visit
Admission: RE | Admit: 2017-07-24 | Discharge: 2017-07-24 | Disposition: A | Discharge status: Home / Self Care | Payer: Self-pay | Source: Ambulatory Visit | Attending: Orthopedic Surgery | Admitting: Orthopedic Surgery

## 2017-07-24 ENCOUNTER — Ambulatory Visit
Admission: RE | Admit: 2017-07-24 | Discharge: 2017-07-24 | Disposition: A | Discharge status: Home / Self Care | Payer: BLUE CROSS/BLUE SHIELD | Source: Ambulatory Visit | Attending: Orthopedic Surgery | Admitting: Orthopedic Surgery

## 2017-07-24 DIAGNOSIS — M533 Sacrococcygeal disorders, not elsewhere classified: Secondary | ICD-10-CM

## 2017-07-24 MED ORDER — METHYLPREDNISOLONE ACETATE 40 MG/ML INJ SUSP (RADIOLOG: 120.0000 mg | Freq: Once | INTRAMUSCULAR | Status: DC

## 2017-12-12 DIAGNOSIS — D691 Qualitative platelet defects: Secondary | ICD-10-CM | POA: Insufficient documentation

## 2017-12-27 ENCOUNTER — Other Ambulatory Visit: Payer: Self-pay | Admitting: Physical Medicine and Rehabilitation

## 2017-12-27 DIAGNOSIS — M545 Low back pain: Principal | ICD-10-CM

## 2017-12-27 DIAGNOSIS — G8929 Other chronic pain: Secondary | ICD-10-CM

## 2017-12-28 ENCOUNTER — Telehealth: Payer: Self-pay | Admitting: Nurse Practitioner

## 2017-12-28 NOTE — Telephone Encounter (Signed)
Phone call to patient to verify medication list and allergies for myelogram. Pt instructed to hold elavil and celebrex 48hrs prior to myelogram appointment time. Pt verbalized understanding.

## 2018-01-11 ENCOUNTER — Ambulatory Visit
Admission: RE | Admit: 2018-01-11 | Discharge: 2018-01-11 | Disposition: A | Discharge status: Home / Self Care | Payer: BLUE CROSS/BLUE SHIELD | Source: Ambulatory Visit | Attending: Physical Medicine and Rehabilitation | Admitting: Physical Medicine and Rehabilitation

## 2018-01-11 DIAGNOSIS — M545 Low back pain: Principal | ICD-10-CM

## 2018-01-11 DIAGNOSIS — G8929 Other chronic pain: Secondary | ICD-10-CM

## 2018-01-11 DIAGNOSIS — L309 Dermatitis, unspecified: Secondary | ICD-10-CM | POA: Insufficient documentation

## 2018-01-11 DIAGNOSIS — L719 Rosacea, unspecified: Secondary | ICD-10-CM | POA: Insufficient documentation

## 2018-01-11 MED ORDER — ONDANSETRON HCL 4 MG/2ML IJ SOLN
4.0000 mg | Freq: Once | INTRAMUSCULAR | Status: AC
  Administered 2018-01-11: 4 mg via INTRAMUSCULAR

## 2018-01-11 MED ORDER — IOPAMIDOL (ISOVUE-M 200) INJECTION 41%
18.0000 mL | Freq: Once | INTRAMUSCULAR | Status: AC
  Administered 2018-01-11: 18 mL via INTRATHECAL

## 2018-01-11 MED ORDER — MEPERIDINE HCL 100 MG/ML IJ SOLN
75.0000 mg | Freq: Once | INTRAMUSCULAR | Status: AC
  Administered 2018-01-11: 75 mg via INTRAMUSCULAR

## 2018-01-11 MED ORDER — DIAZEPAM 5 MG PO TABS
10.0000 mg | ORAL_TABLET | Freq: Once | ORAL | Status: AC
  Administered 2018-01-11: 10 mg via ORAL

## 2018-01-11 MED ORDER — ONDANSETRON HCL 4 MG/2ML IJ SOLN: 4.0000 mg | Freq: Four times a day (QID) | INTRAMUSCULAR | Status: DC | PRN

## 2018-01-11 NOTE — Discharge Instructions (Signed)
Myelogram Discharge Instructions  1. Go home and rest quietly for the next 24 hours.  It is important to lie flat for the next 24 hours.  Get up only to go to the restroom.  You may lie in the bed or on a couch on your back, your stomach, your left side or your right side.  You may have one pillow under your head.  You may have pillows between your knees while you are on your side or under your knees while you are on your back.  2. DO NOT drive today.  Recline the seat as far back as it will go, while still wearing your seat belt, on the way home.  3. You may get up to go to the bathroom as needed.  You may sit up for 10 minutes to eat.  You may resume your normal diet and medications unless otherwise indicated.  Drink plenty of extra fluids today and tomorrow.  4. The incidence of a spinal headache with nausea and/or vomiting is about 5% (one in 20 patients).  If you develop a headache, lie flat and drink plenty of fluids until the headache goes away.  Caffeinated beverages may be helpful.  If you develop severe nausea and vomiting or a headache that does not go away with flat bed rest, call [phone].  5. You may resume normal activities after your 24 hours of bed rest is over; however, do not exert yourself strongly or do any heavy lifting tomorrow.  6. Call your physician for a follow-up appointment.     You may resume Celebrex and Elavil on Saturday, January 12, 2018 after 1:00p.m.

## 2018-01-11 NOTE — Progress Notes (Signed)
Patient states she has been off Cymbalta and Elavil for at least the past two days.  Donell SievertJeanne Shaylee Stanislawski, RN

## 2018-05-06 ENCOUNTER — Telehealth: Payer: Self-pay | Admitting: Oncology

## 2018-05-06 NOTE — Telephone Encounter (Signed)
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 Pt has been scheduled to see Dr. Truett Perna on 1/18 at 1130am. Aware to arrive 30 minutes early.

## 2018-05-08 ENCOUNTER — Telehealth: Payer: Self-pay | Admitting: Oncology

## 2018-05-08 NOTE — Telephone Encounter (Signed)
Pt returned my call. I explained to her that per Dr. Truett Perna she should be seen by her hematologist at [facility]. Per pt [facility] wants her to be seen in [place]. I explained that insurance may not pay for her to have two specialist. I also explained that since she was being treated for the platelet disorder at [facility], then that's where she should return.   Emailed Katie and Dr. Truett Perna to have someone explain to the pt. Appt scheduled for 1/18 ahs been cancelled per Dr. Truett Perna.

## 2018-05-08 NOTE — Telephone Encounter (Signed)
Cld and lft the pt a vm to explain Dr. Truett Perna is wanting her to return to her hematologist at [facility] for her care and that her appt w/him has been cancelled.

## 2018-05-09 ENCOUNTER — Telehealth: Payer: Self-pay | Admitting: Hematology

## 2018-05-09 ENCOUNTER — Encounter: Payer: Self-pay | Admitting: Hematology

## 2018-05-09 NOTE — Telephone Encounter (Signed)
A new hem appt has been scheduled for the pt to see Dr. Candise Che on 2/4 at 1pm. Pt aware to arrive 30 minutes early. Letter mailed.

## 2018-05-11 ENCOUNTER — Encounter: Payer: BLUE CROSS/BLUE SHIELD | Admitting: Oncology

## 2018-05-28 ENCOUNTER — Inpatient Hospital Stay: Payer: BLUE CROSS/BLUE SHIELD | Admitting: Hematology

## 2018-05-28 ENCOUNTER — Telehealth: Payer: Self-pay | Admitting: Hematology

## 2018-05-28 NOTE — Telephone Encounter (Signed)
Patient came by scheduling to reschedule today's new patient appointment due to being roomed late. Spoke with desk nurse and gave patient new appointment for 2/11.

## 2018-06-03 NOTE — Progress Notes (Signed)
This encounter was created in error - please disregard.

## 2018-06-04 ENCOUNTER — Encounter: Payer: BLUE CROSS/BLUE SHIELD | Admitting: Hematology

## 2018-06-06 ENCOUNTER — Telehealth: Payer: Self-pay | Admitting: Hematology

## 2018-06-06 NOTE — Telephone Encounter (Signed)
Pt cld to reschedule appt from 2/11. Pt has been rescheduled to see Dr. Candise Che on 3/11 at 1pm. Pt has been made aware to arrive 30 minutes early.

## 2018-06-07 ENCOUNTER — Telehealth: Payer: Self-pay | Admitting: Hematology

## 2018-06-07 NOTE — Telephone Encounter (Signed)
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 I asked if she could have the nurse or MD call the pt and let her know we will be cancelling her appt w/Dr. Candise Che on 3/11.

## 2018-07-01 ENCOUNTER — Telehealth: Payer: Self-pay | Admitting: *Deleted

## 2018-07-01 NOTE — Telephone Encounter (Signed)
Patient called to find out when her appt at [facility] is scheduled. States [facility] will schedule her knee replacement once she has clearance for surgery. Her appt here was cancelled 2/14 - [facility] notified and they were to notify her. 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 Per Dr. Candise Che, they have resources to provide the care she needs. He recommends she continue to see them for her hematologic care.  Noted in patient chart that patient has appt with ortho at [facility] on 08/12/2018. Patient states that [facility] could not do a knee replacement until June, so she went to [facility]. Informed her that the patient coordinator (Meridith) at [facility] was informed 2/14 that patient should continue care at [facility] and [facility] was to notify her. Patient states she was not notified. Patient verbalized understanding of recommendation to continue care at present practice at [facility].

## 2018-07-03 ENCOUNTER — Encounter: Payer: BLUE CROSS/BLUE SHIELD | Admitting: Hematology

## 2018-10-29 ENCOUNTER — Other Ambulatory Visit: Payer: Self-pay

## 2018-10-29 ENCOUNTER — Encounter: Payer: Self-pay | Admitting: Physical Therapy

## 2018-10-29 ENCOUNTER — Ambulatory Visit: Payer: BLUE CROSS/BLUE SHIELD | Attending: Internal Medicine | Admitting: Physical Therapy

## 2018-10-29 DIAGNOSIS — M6281 Muscle weakness (generalized): Secondary | ICD-10-CM | POA: Diagnosis present

## 2018-10-29 DIAGNOSIS — G8929 Other chronic pain: Secondary | ICD-10-CM | POA: Diagnosis present

## 2018-10-29 DIAGNOSIS — M25562 Pain in left knee: Secondary | ICD-10-CM | POA: Insufficient documentation

## 2018-10-29 DIAGNOSIS — R262 Difficulty in walking, not elsewhere classified: Secondary | ICD-10-CM | POA: Insufficient documentation

## 2018-10-29 NOTE — Patient Instructions (Signed)
Access Code: [email or web address]  URL: [email or web address]  Date: 10/29/2018  Prepared by: Blanche East   Exercises  Long Sitting Quad Set - 15 reps - 2 sets - 5 hold - 1x daily - 7x weekly  Supine Knee Extension Strengthening - 15 reps - 2 sets - 1x daily - 7x weekly  Seated Long Arc Quad - 15 reps - 2 sets - 1x daily - 7x weekly  Standing Hip Flexion with Resistance Loop - 15 reps - 2 sets - 1x daily - 7x weekly  Hip Abduction with Resistance Loop - 15 reps - 2 sets - 1x daily - 7x weekly  Hip Extension with Resistance Loop - 15 reps - 2 sets - 1x daily - 7x weekly  Standing Hip Flexion March - 15 reps - 2 sets - 1x daily - 7x weekly

## 2018-10-30 NOTE — Therapy (Signed)
[place] MAIN [facility] SERVICES [address] [place], [place], [address] Phone: [phone]   Fax:  [phone]  Physical Therapy Evaluation  Patient Details  Name: Rebekah Garrett MRN: [id number] Date of Birth: 05-31-1970 Referring Provider (PT): Dr. Sheilah Pigeon MD   Encounter Date: 10/29/2018  PT End of Session - 10/30/18 1337    Visit Number  1    Number of Visits  5    Date for PT Re-Evaluation  11/27/18    PT Start Time  2671    PT Stop Time  1525    PT Time Calculation (min)  53 min    Activity Tolerance  Patient tolerated treatment well    Behavior During Therapy  [facility] for tasks assessed/performed       Past Medical History:  Diagnosis Date  . Chronic tension headache   . Eczema   . GERD (gastroesophageal reflux disease)   . Hypertension   . Leg pain   . Seasonal allergies     Past Surgical History:  Procedure Laterality Date  . BLADDER SURGERY    . TUBAL LIGATION      There were no vitals filed for this visit.   Subjective Assessment - 10/29/18 1439    Subjective  "I need to lose weight so that I can get my knee replacement surgery."    Pertinent History  48 yo Female reports chronic left knee pain since 2015. 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 She reports that her BMI needs to be less than 40 to qualify; She has already been cleared by hematology and orthopedics but is waiting on weight loss; She reports in addition she has been having swelling in feet and legs which she states has been better; She is currently walking independently; She reports knee pain is worse with activity but is not bothersome when resting; She has had multiple injections which has helped some but is just  temporary relief;    Limitations  Standing;Walking    How long can you sit comfortably?  NA    How long can you stand comfortably?  30+ min    How long can you walk comfortably?  1-2 blocks    Diagnostic tests  knee arthritis noted on left side per recent X-rays; no other abnormality noted;    Patient Stated Goals  "strengthen body and lose fat"    Currently in Pain?  Yes    Pain Score  8     Pain Location  Knee    Pain Orientation  Left    Pain Descriptors / Indicators  Aching    Pain Type  Chronic pain    Pain Onset  More than a month ago    Pain Frequency  Intermittent    Aggravating Factors   standing/walking    Pain Relieving Factors  rest    Effect of Pain on Daily Activities  decreased walking tolerance;    Multiple Pain Sites  No         [facility] PT Assessment - 10/30/18 0001      Assessment   Medical Diagnosis  left knee pain    Referring Provider (PT)  Dr. Sheilah Pigeon MD    Onset Date/Surgical Date  --  2015   Hand Dominance  Right    Next MD Visit  unsure    Prior Therapy  denies any PT for this condition;       Precautions   Precautions  None    Required Braces or Orthoses  --   has knee brace, but hasn't been wearing it;      Restrictions   Weight Bearing Restrictions  No      Balance Screen   Has the patient fallen in the past 6 months  No    Has the patient had a decrease in activity level because of a fear of falling?   No    Is the patient reluctant to leave their home because of a fear of falling?   No      Home Environment   Additional Comments  lives in 2 story home with 5 steps to enter house; negotiates steps non-reciprocally and sometimes sideways due to pain;       Prior Function   Level of Independence  Independent    Vocation  On disability    Vocation Requirements  squatting, lifting; works in a warehouse    Leisure  unsure      Cognition   Overall Cognitive Status  Within Functional Limits for tasks assessed       Observation/Other Assessments   Lower Extremity Functional Scale   32/80 ((the lower the score the greater the disability)      Sensation   Light Touch  Appears Intact    Proprioception  Appears Intact      Coordination   Gross Motor Movements are Fluid and Coordinated  Yes    Fine Motor Movements are Fluid and Coordinated  Yes      Posture/Postural Control   Posture Comments  WFL      AROM   Overall AROM Comments  Knee: flexion: R: 120 L: 110, extension: R: -5 degrees, L: -5 degrees      Strength   Overall Strength Comments  BLE grossly 5/5 with exception of hip extension 4-/5, left hip adduction 4-/5      Palpation   Patella mobility  good mobility bilaterally; does have tenderness to palpation of medial left patella;       Posterior Apprehension test   Findings  Negative    Side  Left      Anterior Apprehension test   Findings  Negative    Side  Left      Transfers   Comments  able to transfer sit<>Stand without pushing on arm rests, slightly slower with mild deviation/compensation due to left knee pain      Ambulation/Gait   Gait Comments  ambulates with wide base of support, with mild antalgic gait pattern noted iwth decreased weight shift to LLE during stance and short step length on RLE (mild deviation)      Standardized Balance Assessment   Five times sit to stand comments   12.98  sec without UE (>10 sec indicates high fall risk)    10 Meter Walk  1.17 m/s without AD;       High Level Balance   High Level Balance Comments  static standing balance is good, dynamic standing balance is good;                 Objective measurements completed on examination: See above findings.    Instructed patient in HEP: Long sitting quad set 5 sec hold x5 reps; SAQ x5 reps; LAQ x5  reps Patient required min-moderate verbal/tactile cues for correct exercise technique including cues to avoid painful ROM;  Standing with green tband around BLE: -hip abduction x5 reps  bilaterally; -hip flexion x5 reps bilaterally; -hip extension x5 reps bilaterally;  No band, alternate march x5 reps with cues to increase hip flexion ROM to painfree ROM;  Provided written HEP for better adherence;           PT Education - 10/30/18 1337    Education Details  POC/HEP    Person(s) Educated  Patient    Methods  Explanation;Verbal cues    Comprehension  Verbalized understanding;Returned demonstration;Verbal cues required;Need further instruction       PT Short Term Goals - 10/30/18 1344      PT SHORT TERM GOAL #1   Title  Patient will be adherent to HEP at least 3x a week to improve functional strength and balance for better safety at home.    Time  2    Period  Weeks    Status  New    Target Date  11/13/18      PT SHORT TERM GOAL #2   Title  Patient will improve LLE knee AROM 0-120 degrees to improve functional ROM prior to surgery for better surgical outcomes and improve mobility;    Time  2    Period  Weeks    Status  New    Target Date  11/13/18        PT Long Term Goals - 10/30/18 1345      PT LONG TERM GOAL #1   Title  Patient (< 476 years old) will complete five times sit to stand test in < 10 seconds indicating an increased LE strength and improved balance.    Time  4    Period  Weeks    Status  New    Target Date  11/27/18      PT LONG TERM GOAL #2   Title  Patient will be independent with ascend/descend 12 steps using single UE in step over step pattern without LOB and without increase in knee pain for better home mobility;    Time  4    Period  Weeks    Status  New    Target Date  11/27/18      PT LONG TERM GOAL #3   Title  Patient will increase lower extremity functional scale to >50/80 to demonstrate improved functional mobility and increased tolerance with ADLs.    Time  4    Period  Weeks    Status  New    Target Date  11/27/18             Plan - 10/30/18 1338    Clinical Impression Statement  48 yo Female presents  to therapy with chronic left knee pain. She is scheduled to have a total knee replacement but needs to lose weight before surgery. She does exhibit some stiffness in left knee and mild weakness in LLE hip. She would benefit from skilled PT intervention to improve flexibility and strength prior to surgery for better post surgical outcomes. Patient would benefit from several sessions to establish a home exercise program for better mobility.    Personal Factors and Comorbidities  Comorbidity 3+;Education;Time since onset of injury/illness/exacerbation    Comorbidities  osteoarthritis, fibromyalgia, HTN    Examination-Activity Limitations  Carry;Caring for Others;Lift;Locomotion Level;Squat;Stairs;Stand;Transfers    Examination-Participation Restrictions  Church;Cleaning;Community Activity;Shop;Volunteer;Yard Work    Stability/Clinical Decision Making  Stable/Uncomplicated  Clinical Decision Making  Low    Rehab Potential  Fair    PT Frequency  Other (comment)   2x a week for first week and then1 x a week for 3 additional weeks   PT Duration  4 weeks    PT Treatment/Interventions  ADLs/Self Care Home Management;Aquatic Therapy;Cryotherapy;Electrical Stimulation;Moist Heat;Ultrasound;Gait training;Stair training;Functional mobility training;Therapeutic activities;Therapeutic exercise;Balance training;Neuromuscular re-education;Patient/family education;Orthotic Fit/Training;Manual techniques;Passive range of motion;Energy conservation;Taping    PT Next Visit Plan  address HEP; discuss brace options    PT Home Exercise Plan  initiated    Consulted and Agree with Plan of Care  Patient       Patient will benefit from skilled therapeutic intervention in order to improve the following deficits and impairments:  Abnormal gait, Cardiopulmonary status limiting activity, Decreased activity tolerance, Decreased balance, Decreased endurance, Decreased mobility, Decreased range of motion, Decreased safety  awareness, Decreased strength, Difficulty walking, Impaired flexibility, Obesity  Visit Diagnosis: 1. Chronic pain of left knee   2. Muscle weakness (generalized)   3. Difficulty in walking, not elsewhere classified        Problem List Patient Active Problem List   Diagnosis Date Noted  . Rosacea 01/11/2018  . Eczema 01/11/2018  . 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 Malfunction of spinal cord stimulator (HCC) 03/09/2016  . Impaired glucose tolerance 02/04/2014  . Leg pain, diffuse 01/08/2014  . Chronic tension-type headache, not intractable 01/08/2014  . Gastroesophageal reflux disease without esophagitis 09/11/2013  . Other chronic cystitis without hematuria 07/17/2013  . Incomplete emptying of bladder 07/17/2013  . Increased frequency of urination 07/17/2013    Romona Murdy PT, DPT 10/30/2018, 1:47 PM  [place] [facility] [address] [place], [place], [address] Phone: [phone]   Fax:  [phone]  Name: Maryann AlarKristie D Nunziato MRN: [id number] Date of Birth: 1970-12-29

## 2018-10-31 ENCOUNTER — Other Ambulatory Visit: Payer: Self-pay

## 2018-10-31 ENCOUNTER — Ambulatory Visit: Payer: BLUE CROSS/BLUE SHIELD | Admitting: Physical Therapy

## 2018-10-31 ENCOUNTER — Encounter: Payer: Self-pay | Admitting: Physical Therapy

## 2018-10-31 DIAGNOSIS — R262 Difficulty in walking, not elsewhere classified: Secondary | ICD-10-CM

## 2018-10-31 DIAGNOSIS — M25562 Pain in left knee: Secondary | ICD-10-CM

## 2018-10-31 DIAGNOSIS — M6281 Muscle weakness (generalized): Secondary | ICD-10-CM

## 2018-10-31 DIAGNOSIS — G8929 Other chronic pain: Secondary | ICD-10-CM

## 2018-10-31 NOTE — Therapy (Signed)
[place] [facility] [address] [place], [place], [address] Phone: [phone]   Fax:  [phone]  Physical Therapy Treatment  Patient Details  Name: Rebekah AlarKristie D Garrett MRN: [id number] Date of Birth: 03-09-1971 Referring Provider (PT): Dr. Marguarite ArbourKajal Zalavadia MD   Encounter Date: 10/31/2018  PT End of Session - 10/31/18 1311    Visit Number  2    Number of Visits  5    Date for PT Re-Evaluation  11/27/18    PT Start Time  0102    PT Stop Time  0145    PT Time Calculation (min)  43 min    Activity Tolerance  Patient tolerated treatment well    Behavior During Therapy  [facility] for tasks assessed/performed       Past Medical History:  Diagnosis Date  . Chronic tension headache   . Eczema   . GERD (gastroesophageal reflux disease)   . Hypertension   . Leg pain   . Seasonal allergies     Past Surgical History:  Procedure Laterality Date  . BLADDER SURGERY    . TUBAL LIGATION      There were no vitals filed for this visit.  Subjective Assessment - 10/31/18 1309    Subjective  She reports that her gazelle workout today made her L knee hurt a little.    Pertinent History  48 yo Female reports chronic left knee pain since 2015. 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 She reports that her BMI needs to be less than 40 to qualify; She has already been cleared by hematology and orthopedics but is waiting on weight loss; She reports in addition she has been having swelling in feet and legs which she states has been better; She is currently walking independently; She reports knee pain is worse with activity but is not bothersome when resting; She has had multiple injections which has helped some but is just  temporary relief;    Limitations  Standing;Walking    How long can you sit comfortably?  NA    How long can you stand comfortably?  30+ min    How long can you walk comfortably?  1-2 blocks    Diagnostic tests  knee arthritis noted on left side per recent X-rays; no other abnormality noted;    Patient Stated Goals  "strengthen body and lose fat"    Currently in Pain?  Yes    Pain Score  2     Pain Location  Knee    Pain Orientation  Left    Pain Descriptors / Indicators  Aching    Pain Type  Chronic pain    Pain Onset  More than a month ago    Pain Frequency  Constant    Aggravating Factors   exercise    Pain Relieving Factors  rest    Effect of Pain on Daily Activities  makes it harder to move          Treatment:  Octane fitness x 5 mins L 6  hooklying marching with 3 lbs and UE reaching x 1 min x 2 sets   Bridges x  1 min x 2 sets   Bicycle with UE reaching x 1 min x  2 sets   Kneeling with theraball trunk rotation left and right x 1 mins left , 1 min right x 2 sets  Hip abd in correct position and cues to tighten hip and thigh muscles x 1 min x 2 sets left and right , 3 lb weights except for LLE 2 set  Quadriped alternating Ue and LE extension with 10 sec hold x 1 mins x 2 sets      Patient needs vC for correct technique and form.                  PT Education - 10/31/18 1310    Education Details  HEP    Person(s) Educated  Patient    Methods  Explanation    Comprehension  Verbalized understanding       PT Short Term Goals - 10/30/18 1344      PT SHORT TERM GOAL #1   Title  Patient will be adherent to HEP at least 3x a week to improve functional strength and balance for better safety at home.    Time  2    Period  Weeks    Status  New    Target Date  11/13/18      PT SHORT TERM GOAL #2   Title  Patient will improve LLE knee AROM 0-120 degrees to improve functional ROM prior to surgery for better surgical outcomes and improve mobility;     Time  2    Period  Weeks    Status  New    Target Date  11/13/18        PT Long Term Goals - 10/30/18 1345      PT LONG TERM GOAL #1   Title  Patient (< 60 years old) will complete five times sit to stand test in < 10 seconds indicating an increased LE strength and improved balance.    Time  4    Period  Weeks    Status  New    Target Date  11/27/18      PT LONG TERM GOAL #2   Title  Patient will be independent with ascend/descend 12 steps using single UE in step over step pattern without LOB and without increase in knee pain for better home mobility;    Time  4    Period  Weeks    Status  New    Target Date  11/27/18      PT LONG TERM GOAL #3   Title  Patient will increase lower extremity functional scale to >50/80 to demonstrate improved functional mobility and increased tolerance with ADLs.    Time  4    Period  Weeks    Status  New    Target Date  11/27/18            Plan - 10/31/18 1311    Clinical Impression Statement  Patient performs strengthening of LE's without increased pain. She is doing a 20 min work out 3 x a day. She will conitnue to benefit from skilled PT to improve    Personal Factors and Comorbidities  Comorbidity 3+;Education;Time since onset of injury/illness/exacerbation    Comorbidities  osteoarthritis, fibromyalgia, HTN    Examination-Activity Limitations  Carry;Caring for Others;Lift;Locomotion Level;Squat;Stairs;Stand;Transfers    Examination-Participation Restrictions  Church;Cleaning;Community Activity;Shop;Volunteer;Yard Work    Stability/Clinical Decision Making  Stable/Uncomplicated    Rehab Potential  Fair    PT Frequency  Other (comment)   2x a week for first week and then1 x  a week for 3 additional weeks   PT Duration  4 weeks    PT Treatment/Interventions  ADLs/Self Care Home Management;Aquatic Therapy;Cryotherapy;Electrical Stimulation;Moist Heat;Ultrasound;Gait training;Stair training;Functional mobility training;Therapeutic  activities;Therapeutic exercise;Balance training;Neuromuscular re-education;Patient/family education;Orthotic Fit/Training;Manual techniques;Passive range of motion;Energy conservation;Taping    PT Next Visit Plan  address HEP; discuss brace options    PT Home Exercise Plan  initiated    Consulted and Agree with Plan of Care  Patient       Patient will benefit from skilled therapeutic intervention in order to improve the following deficits and impairments:  Abnormal gait, Cardiopulmonary status limiting activity, Decreased activity tolerance, Decreased balance, Decreased endurance, Decreased mobility, Decreased range of motion, Decreased safety awareness, Decreased strength, Difficulty walking, Impaired flexibility, Obesity  Visit Diagnosis: 1. Chronic pain of left knee   2. Muscle weakness (generalized)   3. Difficulty in walking, not elsewhere classified        Problem List Patient Active Problem List   Diagnosis Date Noted  . Rosacea 01/11/2018  . Eczema 01/11/2018  . 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 Malfunction of spinal cord stimulator (HCC) 03/09/2016  . Impaired glucose tolerance 02/04/2014  . Leg pain, diffuse 01/08/2014  . Chronic tension-type headache, not intractable 01/08/2014  . Gastroesophageal reflux disease without esophagitis 09/11/2013  . Other chronic cystitis without hematuria 07/17/2013  . Incomplete emptying of bladder 07/17/2013  . Increased frequency of urination 07/17/2013    Ezekiel InaMansfield, Amila Callies S, [place]PT DPT 10/31/2018, 1:14 PM  [place] [facility] [address] [place], [place], [address] Phone: [phone]   Fax:  [phone]  Name: Rebekah AlarKristie D Garrett MRN: [id number] Date of Birth: [birth year]

## 2018-11-05 ENCOUNTER — Other Ambulatory Visit: Payer: Self-pay

## 2018-11-05 ENCOUNTER — Ambulatory Visit: Payer: BLUE CROSS/BLUE SHIELD | Admitting: Physical Therapy

## 2018-11-05 ENCOUNTER — Encounter: Payer: Self-pay | Admitting: Physical Therapy

## 2018-11-05 DIAGNOSIS — M25562 Pain in left knee: Secondary | ICD-10-CM | POA: Diagnosis not present

## 2018-11-05 DIAGNOSIS — R262 Difficulty in walking, not elsewhere classified: Secondary | ICD-10-CM

## 2018-11-05 DIAGNOSIS — G8929 Other chronic pain: Secondary | ICD-10-CM

## 2018-11-05 DIAGNOSIS — M6281 Muscle weakness (generalized): Secondary | ICD-10-CM

## 2018-11-05 IMAGING — CT CT L SPINE W/ CM
1 of 6 series · 6 of 14 positions shown, 8 images · non-contrast
Comparison: CT 10/27/2016

CLINICAL DATA: Low back pain, worse with standing and twisting.
TECHNIQUE: Contiguous axial images were obtained through the Lumbar spine after
the intrathecal infusion of infusion. Coronal and sagittal
reconstructions were obtained of the axial image sets.

[Series 3: l spine soft · axial · 0.29mm/px · z∈[-240,-60]mm · 6 of 86 slices shown, 8 images]
[im 13/86  soft-tissue]
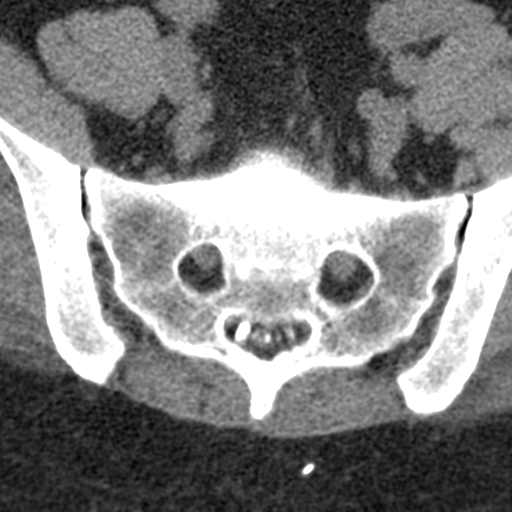
[im 13/86  bone]
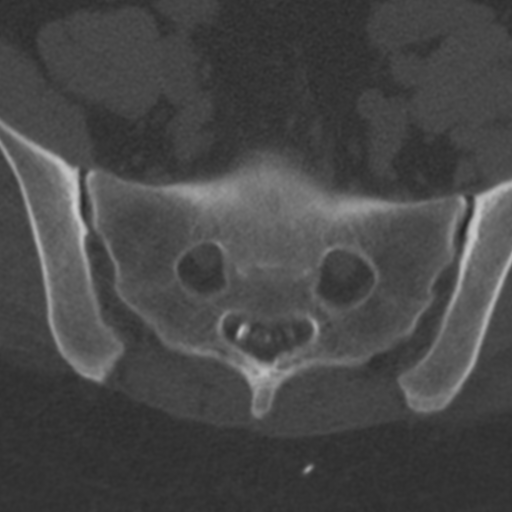
[im 25/86  bone]
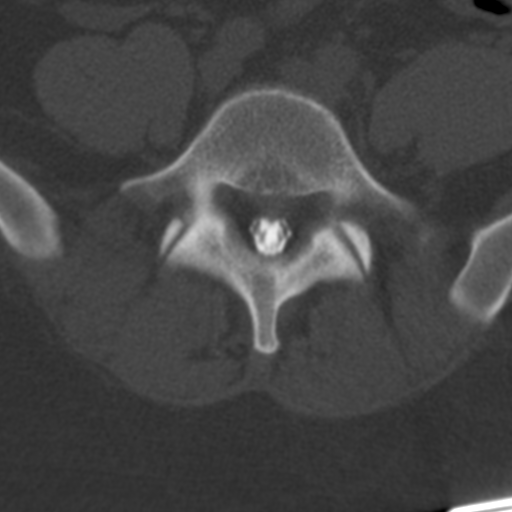
[im 37/86  bone]
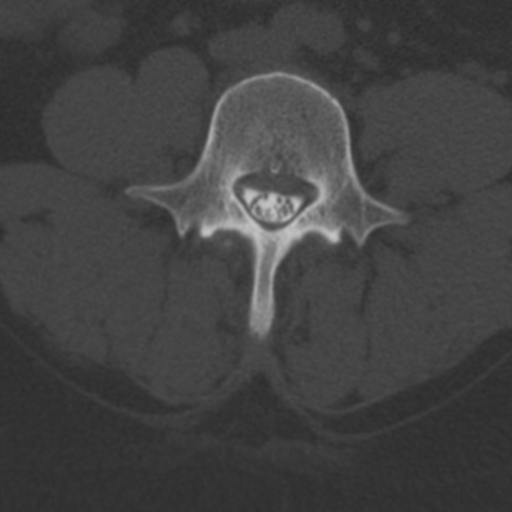
[im 49/86  bone]
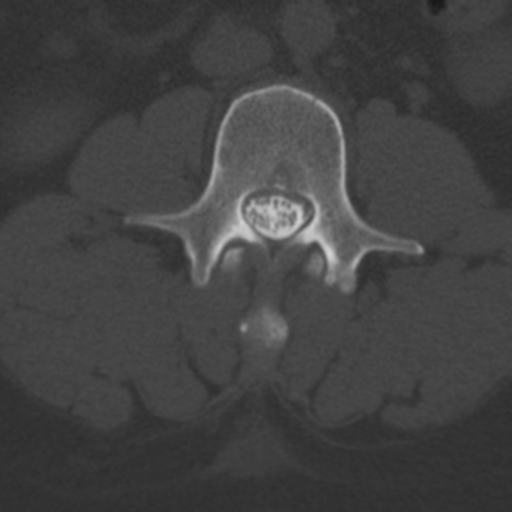
[im 61/86  soft-tissue]
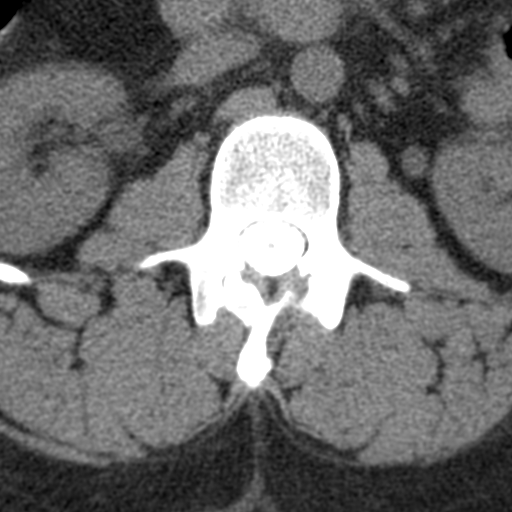
[im 61/86  bone]
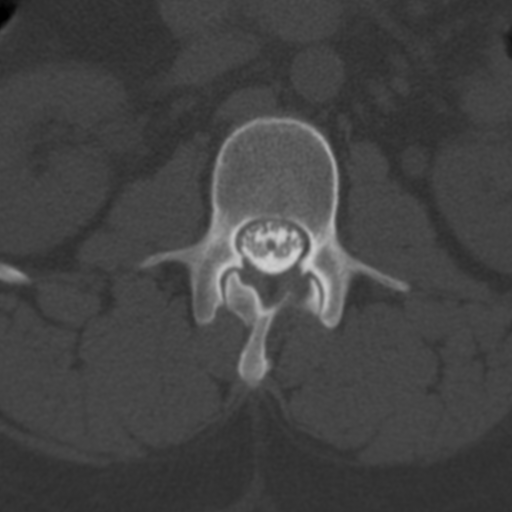
[im 73/86  bone]
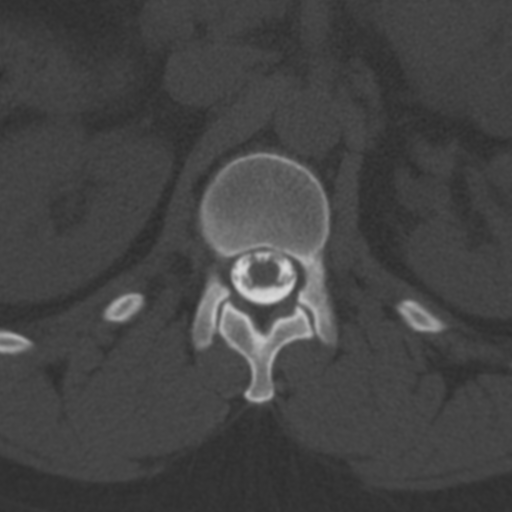

[6 of 14 positions shown; findings below may reference images not displayed]

EXAM:
LUMBAR MYELOGRAM

FLUOROSCOPY TIME:  0 minutes 39 seconds. 195.10 micro gray meter
squared

PROCEDURE:
After thorough discussion of risks and benefits of the procedure
including bleeding, infection, injury to nerves, blood vessels,
adjacent structures as well as headache and CSF leak, written and
oral informed consent was obtained. Consent was obtained by Dr. Excursions
Gonys. Time out form was completed.

Patient was positioned prone on the fluoroscopy table. Local
anesthesia was provided with 1% lidocaine without epinephrine after
prepped and draped in the usual sterile fashion. Puncture was
performed at L3-4 using a 3 1/2 inch 22-gauge spinal needle via
right paramedian approach. Using a single pass through the dura, the
needle was placed within the thecal sac, with return of clear CSF.
15 mL of Isovue Q-IBB was injected into the thecal sac, with normal
opacification of the nerve roots and cauda equina consistent with
free flow within the subarachnoid space.

I personally performed the lumbar puncture and administered the
intrathecal contrast. I also personally performed acquisition of the
myelogram images.
FINDINGS: LUMBAR MYELOGRAM FINDINGS:

Standard imaging is normal. No extradural defects. No spinal
stenosis. No abnormalities of root sleeve filling. Standing flexion
extension views show 1 mm of anterolisthesis at L4-5 with flexion.
Sacral neurostimulator in place.

CT LUMBAR MYELOGRAM FINDINGS:

No abnormality at L3-4 or above.

At L4-5, there is mild facet osteoarthritis. I think the disc is
normal. Ventral epidural veins are present. I do not think these
represent extruded disc material. There is no sign of disc space
narrowing or disc degeneration. On the prone and standing myelogram
views, the thecal sac is in immediate contiguity with the level of
the posterior annulus and posterior vertebral bodies, not showing
displacement as would be the case if this were disc material.

At L5-S1, the disc is normal. The canal and foramina are widely
patent. No facet arthropathy.

Sacroiliac osteoarthritis as evidenced by vacuum phenomenon of the
sacroiliac joints.

Left sacral neurostimulator in place in the left S3 sacral foramen.
IMPRESSION: Nearly normal examination. At myelography, the only abnormality is 1
mm of anterolisthesis at L4-5 with flexion. There is no evidence of
spinal stenosis, extra dural defect or abnormal root sleeve filling.
At CT, mild facet arthropathy at L4-5 is demonstrated. I think the
discs are normal. Ventral epidural tissue in the midline extending
from upper L4 through L5 probably represents normal dilated epidural
veins rather than extruded migrated disc material. See above
discussion.

Patient does have sacroiliac osteoarthritis as evidenced by vacuum
phenomenon affecting both sacroiliac joints.

## 2018-11-05 NOTE — Patient Instructions (Signed)
Access Code: [email or web address]  URL: [email or web address]  Date: 11/05/2018  Prepared by: Blanche East   Exercises  Mini Squat to Shoulder Press with Barbell - 10 reps - 2 sets - 1x daily - 7x weekly  Standard Lunge - 10 reps - 2 sets - 1x daily - 7x weekly  Side Stepping with Resistance at Ankles and Counter Support - 10 reps - 2 sets - 1x daily - 7x weekly  Diagonal Forward Stepping with Resistance Loop - 5 reps - 2 sets - 1x daily - 7x weekly  Prone Quadriceps Set - 10 reps - 2 sets - 3 hold - 1x daily - 7x weekly  Prone Knee Flexion - 15 reps - 2 sets - 1x daily - 7x weekly  Prone Hip Extension with Bent Knee - One Pillow - 15 reps - 2 sets - 1x daily - 7x weekly

## 2018-11-05 NOTE — Therapy (Signed)
[place] MAIN [facility] SERVICES [address] [place], [place], [address] Phone: [phone]   Fax:  [phone]  Physical Therapy Treatment  Patient Details  Name: Rebekah Garrett MRN: [id number] Date of Birth: 11/29/1970 Referring Provider (PT): Dr. Sheilah Pigeon MD   Encounter Date: 11/05/2018  PT End of Session - 11/05/18 1336    Visit Number  3    Number of Visits  5    Date for PT Re-Evaluation  11/27/18    PT Start Time  1332    PT Stop Time  1415    PT Time Calculation (min)  43 min    Activity Tolerance  Patient tolerated treatment well    Behavior During Therapy  [facility] for tasks assessed/performed       Past Medical History:  Diagnosis Date  . Chronic tension headache   . Eczema   . GERD (gastroesophageal reflux disease)   . Hypertension   . Leg pain   . Seasonal allergies     Past Surgical History:  Procedure Laterality Date  . BLADDER SURGERY    . TUBAL LIGATION      There were no vitals filed for this visit.  Subjective Assessment - 11/05/18 1336    Subjective  Patient reports doing okay; She is having some soreness in left knee as a result of hitting her knee. She reports adherence with HEP;    Pertinent History  48 yo Female reports chronic left knee pain since 2015. 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 She reports that her BMI needs to be less than 40 to qualify; She has already been cleared by hematology and orthopedics but is waiting on weight loss; She reports in addition she has been having swelling in feet and legs which she states has been better; She is currently walking independently; She reports knee pain is worse with activity but is not bothersome when resting; She has  had multiple injections which has helped some but is just temporary relief;    Limitations  Standing;Walking    How long can you sit comfortably?  NA    How long can you stand comfortably?  30+ min    How long can you walk comfortably?  1-2 blocks    Diagnostic tests  knee arthritis noted on left side per recent X-rays; no other abnormality noted;    Patient Stated Goals  "strengthen body and lose fat"    Currently in Pain?  Yes    Pain Score  5     Pain Location  Knee    Pain Orientation  Left    Pain Descriptors / Indicators  Aching;Sore    Pain Type  Chronic pain    Pain Onset  More than a month ago    Pain Frequency  Intermittent    Aggravating Factors   exercise    Pain Relieving Factors  rest    Effect of Pain on Daily Activities  decreased activity tolerance;    Multiple Pain Sites  No       TREATMENT: Warm up on Treadmill 1.7 mph with 2 HHA x3 min (unbilled); Standing calf stretch heel off treadmill 20 sec hold x2 reps bilaterally;  PT advanced HEP to focus  on LE strengthening and mobility:  Squat with shoulder press 3# 2x10 Forward lunge with elbow curl 3# x10 each LE forward Patient required min-moderate verbal/tactile cues for correct exercise technique including cues to keep knee in proper position avoiding bending knee over toes and also to avoid excessive knee flexion for less knee discomfort; also required cues for proper hand placement for better shoulder strengthening;   Standing hip abduction green tband x20 bilaterally;  Side stepping with green tband x10 feet x3 laps each direction; Diagonal stepping with green tband knee x10 feet forward/backward x3 laps each with mod VCs for correct positioning for optimum muscle strengthening;    Prone quad set 3 sec hold x10 bilaterally; Required min VCs to increase hold time for better quad strengthening; Alternate hamstring curl x15 bilaterally with cues to avoid painful ROM; Prone: Gluteal max hip extension to  facilitate better posterior hip strengthening x15 bilaterally with min Vcs for correct positioning to increase hip strength;  Long sitting hamstring stretch 20 sec hold x 2 reps bilaterally with min VCs for correct positioning for optimum flexibility;  Leg press: BLE plate [id number] with min VCs to slow down eccentric return for better motor control and strengthening;    Response to treatment: Patient tolerated well; She requires min VCs for correct exercise technique to improve positioning and avoid injury; Provided written HEP for better adherence to exercise; Patient verbalized and demonstrated understanding. She denies any increase in pain with advanced exercise. Patient does fatigue requiring short seated rest breaks;                        PT Education - 11/05/18 1336    Education Details  LE strengthening, HEP reinforced;    Person(s) Educated  Patient    Methods  Explanation;Verbal cues    Comprehension  Verbalized understanding;Returned demonstration;Verbal cues required;Need further instruction       PT Short Term Goals - 10/30/18 1344      PT SHORT TERM GOAL #1   Title  Patient will be adherent to HEP at least 3x a week to improve functional strength and balance for better safety at home.    Time  2    Period  Weeks    Status  New    Target Date  11/13/18      PT SHORT TERM GOAL #2   Title  Patient will improve LLE knee AROM 0-120 degrees to improve functional ROM prior to surgery for better surgical outcomes and improve mobility;    Time  2    Period  Weeks    Status  New    Target Date  11/13/18        PT Long Term Goals - 10/30/18 1345      PT LONG TERM GOAL #1   Title  Patient (< [age] years old) will complete five times sit to stand test in < 10 seconds indicating an increased LE strength and improved balance.    Time  4    Period  Weeks    Status  New    Target Date  11/27/18      PT LONG TERM GOAL #2   Title  Patient will be  independent with ascend/descend 12 steps using single UE in step over step pattern without LOB and without increase in knee pain for better home mobility;    Time  4    Period  Weeks    Status  New  Target Date  11/27/18      PT LONG TERM GOAL #3   Title  Patient will increase lower extremity functional scale to >50/80 to demonstrate improved functional mobility and increased tolerance with ADLs.    Time  4    Period  Weeks    Status  New    Target Date  11/27/18            Plan - 11/05/18 1352    Clinical Impression Statement  Patient motivated and tolerated session well; Advanced HEP with UE/LE strengthening for full body workout. She requires min VCs for correct exercise technique to avoid injury and improve mobility; Patient tolerated well without increase in pain;She would benefit from additional skilled PT intervention to improve strength, balance and gait safety;    Personal Factors and Comorbidities  Comorbidity 3+;Education;Time since onset of injury/illness/exacerbation    Comorbidities  osteoarthritis, fibromyalgia, HTN    Examination-Activity Limitations  Carry;Caring for Others;Lift;Locomotion Level;Squat;Stairs;Stand;Transfers    Examination-Participation Restrictions  Church;Cleaning;Community Activity;Shop;Volunteer;Yard Work    Stability/Clinical Decision Making  Stable/Uncomplicated    Rehab Potential  Fair    PT Frequency  Other (comment)   2x a week for first week and then1 x a week for 3 additional weeks   PT Duration  4 weeks    PT Treatment/Interventions  ADLs/Self Care Home Management;Aquatic Therapy;Cryotherapy;Electrical Stimulation;Moist Heat;Ultrasound;Gait training;Stair training;Functional mobility training;Therapeutic activities;Therapeutic exercise;Balance training;Neuromuscular re-education;Patient/family education;Orthotic Fit/Training;Manual techniques;Passive range of motion;Energy conservation;Taping    PT Next Visit Plan  address HEP; discuss  brace options    PT Home Exercise Plan  initiated    Consulted and Agree with Plan of Care  Patient       Patient will benefit from skilled therapeutic intervention in order to improve the following deficits and impairments:  Abnormal gait, Cardiopulmonary status limiting activity, Decreased activity tolerance, Decreased balance, Decreased endurance, Decreased mobility, Decreased range of motion, Decreased safety awareness, Decreased strength, Difficulty walking, Impaired flexibility, Obesity  Visit Diagnosis: 1. Chronic pain of left knee   2. Muscle weakness (generalized)   3. Difficulty in walking, not elsewhere classified        Problem List Patient Active Problem List   Diagnosis Date Noted  . Rosacea 01/11/2018  . Eczema 01/11/2018  . 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 Malfunction of spinal cord stimulator (HCC) 03/09/2016  . Impaired glucose tolerance 02/04/2014  . Leg pain, diffuse 01/08/2014  . Chronic tension-type headache, not intractable 01/08/2014  . Gastroesophageal reflux disease without esophagitis 09/11/2013  . Other chronic cystitis without hematuria 07/17/2013  . Incomplete emptying of bladder 07/17/2013  . Increased frequency of urination 07/17/2013    Trotter,Margaret PT, DPT 11/05/2018, 2:14 PM  [place] [facility] [address] [place], [place], [address] Phone: [phone]   Fax:  [phone]  Name: Maryann AlarKristie D Hourihan MRN: [id number] Date of Birth: Dec 09, 1970

## 2018-11-12 ENCOUNTER — Encounter: Payer: Self-pay | Admitting: Physical Therapy

## 2018-11-12 ENCOUNTER — Other Ambulatory Visit: Payer: Self-pay

## 2018-11-12 ENCOUNTER — Ambulatory Visit: Payer: BLUE CROSS/BLUE SHIELD | Admitting: Physical Therapy

## 2018-11-12 DIAGNOSIS — M25562 Pain in left knee: Secondary | ICD-10-CM | POA: Diagnosis not present

## 2018-11-12 DIAGNOSIS — R262 Difficulty in walking, not elsewhere classified: Secondary | ICD-10-CM

## 2018-11-12 DIAGNOSIS — M6281 Muscle weakness (generalized): Secondary | ICD-10-CM

## 2018-11-12 DIAGNOSIS — G8929 Other chronic pain: Secondary | ICD-10-CM

## 2018-11-12 NOTE — Patient Instructions (Signed)
Access Code: [email or web address]  URL: [email or web address]  Date: 11/12/2018  Prepared by: Blanche East   Exercises  Lateral Lunge - 10 reps - 2 sets - 1x daily - 7x weekly  Diagonal Hip Extension with Resistance - 5 reps - 2 sets - 1x daily - 7x weekly  Standing Terminal Knee Extension with Resistance - 15 reps - 2 sets - 1x daily - 7x weekly

## 2018-11-12 NOTE — Therapy (Signed)
[facility] [address] [place], [place], [address] Phone: [phone]   Fax:  [phone]  Physical Therapy Treatment  Patient Details  Name: Rebekah AlarKristie D Garrett MRN: [id number] Date of Birth: 1970-05-30 Referring Provider (PT): Dr. Marguarite ArbourKajal Zalavadia MD   Encounter Date: 11/12/2018  PT End of Session - 11/12/18 1127    Visit Number  4    Number of Visits  5    Date for PT Re-Evaluation  11/27/18    PT Start Time  1120    PT Stop Time  1215    PT Time Calculation (min)  55 min    Activity Tolerance  Patient tolerated treatment well    Behavior During Therapy  [facility] for tasks assessed/performed       Past Medical History:  Diagnosis Date  . Chronic tension headache   . Eczema   . GERD (gastroesophageal reflux disease)   . Hypertension   . Leg pain   . Seasonal allergies     Past Surgical History:  Procedure Laterality Date  . BLADDER SURGERY    . TUBAL LIGATION      There were no vitals filed for this visit.  Subjective Assessment - 11/12/18 1124    Subjective  Patient reports doing okay today; She reports adherence with HEP and reports doing well with her exercises. She reports moderate soreness in left knee; She reports going to [employer] yesterday and states that she was able to walk more in the store with less pain and fatigue.    Pertinent History  48 yo Female reports chronic left knee pain since 2015. 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 She reports that her BMI needs to be less than 40 to qualify; She has already been cleared by hematology and orthopedics but is waiting on weight loss; She reports in addition she has been having swelling in feet and legs which she states has  been better; She is currently walking independently; She reports knee pain is worse with activity but is not bothersome when resting; She has had multiple injections which has helped some but is just temporary relief;    Limitations  Standing;Walking    How long can you sit comfortably?  NA    How long can you stand comfortably?  30+ min    How long can you walk comfortably?  1-2 blocks    Diagnostic tests  knee arthritis noted on left side per recent X-rays; no other abnormality noted;    Patient Stated Goals  "strengthen body and lose fat"    Currently in Pain?  Yes    Pain Score  5     Pain Location  Knee    Pain Orientation  Left    Pain Descriptors / Indicators  Aching;Sore    Pain Type  Chronic pain    Pain Onset  More than a month ago    Pain Frequency  Intermittent    Aggravating Factors   exercise/weight bearing    Pain Relieving Factors  rest    Effect of Pain on Daily Activities  decreased activity tolerance;    Multiple Pain Sites  No            TREATMENT: Warm  up on Treadmill 1.7 mph, 2% incline with 2 HHA x3 min (unbilled); Standing calf stretch heel off treadmill 20 sec hold x2 reps bilaterally;  Standing calf/hamstring stretch 20 sec hold x2 bilaterally;  PT advanced HEP to focus on LE strengthening and mobility:  Side lunge with Shoulder flexion 3# x10 each direction Patient required min-moderate verbal/tactile cues for correct exercise technique including cues to keep knee in proper position avoiding bending knee over toes and also to avoid excessive knee flexion for less knee discomfort; also required cues for proper hand placement for better shoulder strengthening;   Standing hip abduction clock kicks (forward, diagonal, side, back diagonal and backwards) x5 sets each LE with 1 HHA for balance; Required min VCs to avoid trunk rotation for better hip stabilization and strengthening;    Diagonal stepping with green tband around ankles x10 feet  forward/backward x3 laps each with mod VCs for correct positioning for optimum muscle strengthening;   Standing terminal knee extension green tband x20 reps LLE; required min VCs to isolate LLE movement for better quad control;   Long sitting hamstring stretch 20 sec hold x 2 reps bilaterally with min VCs for correct positioning for optimum flexibility; Standing quad stretch 20 sec hold x2 reps bilaterally with min VCs for proper positioning and to hold onto rail/table for better stance control;   Leg press: BLE plate 098#115# [id number] with min VCs to slow down eccentric return for better motor control and strengthening;   Educated patient in HEP with instruction on specific exercises to do each day for proper strengthening and to reduce delayed onset muscle soreness. Patient verbalized understanding. Provided written instruction for better adherence;  Also educated patient in postural strengthening: Standing BUE shoulder extension green tband x10 reps; Standing BUE low rows green tband x10 reps; Posterior shoulder rolls x10 reps; Chin tucks x5 reps; Patient required min VCs for proper positioning and to reduce shoulder elevation for less upper trap activation/straining;  Response to treatment: Patient tolerated well; She requires min VCs for correct exercise technique to improve positioning and avoid injury; Provided written HEP for better adherence to exercise; Also provided written HEP for organizing exercises for weekly exercise program.  Patient verbalized and demonstrated understanding. She denies any increase in pain with advanced exercise. Patient does fatigue requiring short seated rest breaks;                     PT Education - 11/12/18 1126    Education Details  LE strengthening/HEP reinforced;    Person(s) Educated  Patient    Methods  Explanation;Verbal cues    Comprehension  Verbalized understanding;Returned demonstration;Verbal cues required;Need further  instruction       PT Short Term Goals - 10/30/18 1344      PT SHORT TERM GOAL #1   Title  Patient will be adherent to HEP at least 3x a week to improve functional strength and balance for better safety at home.    Time  2    Period  Weeks    Status  New    Target Date  11/13/18      PT SHORT TERM GOAL #2   Title  Patient will improve LLE knee AROM 0-120 degrees to improve functional ROM prior to surgery for better surgical outcomes and improve mobility;    Time  2    Period  Weeks    Status  New    Target Date  11/13/18  PT Long Term Goals - 10/30/18 1345      PT LONG TERM GOAL #1   Title  Patient (< 46 years old) will complete five times sit to stand test in < 10 seconds indicating an increased LE strength and improved balance.    Time  4    Period  Weeks    Status  New    Target Date  11/27/18      PT LONG TERM GOAL #2   Title  Patient will be independent with ascend/descend 12 steps using single UE in step over step pattern without LOB and without increase in knee pain for better home mobility;    Time  4    Period  Weeks    Status  New    Target Date  11/27/18      PT LONG TERM GOAL #3   Title  Patient will increase lower extremity functional scale to >50/80 to demonstrate improved functional mobility and increased tolerance with ADLs.    Time  4    Period  Weeks    Status  New    Target Date  11/27/18            Plan - 11/12/18 1421    Clinical Impression Statement  Patient motivated and tolerated session well. Advanced HEP with UE/LE strengthening exercise. Patient does continue to require min Vcs for correct positioning and exercise technique for optimum muscle activation and to reduce injury. She denies any increase in knee pain with advanced exercise. Provided written instruction for HEP for daily exercise to improve adherence. patient would benefit from additional skilled PT Intervention to improve strength and mobility. Consider discharge next  visit as patient becomes comfortable with HEP;    Personal Factors and Comorbidities  Comorbidity 3+;Education;Time since onset of injury/illness/exacerbation    Comorbidities  osteoarthritis, fibromyalgia, HTN    Examination-Activity Limitations  Carry;Caring for Others;Lift;Locomotion Level;Squat;Stairs;Stand;Transfers    Examination-Participation Restrictions  Church;Cleaning;Community Activity;Shop;Volunteer;Yard Work    Stability/Clinical Decision Making  Stable/Uncomplicated    Rehab Potential  Fair    PT Frequency  Other (comment)   2x a week for first week and then1 x a week for 3 additional weeks   PT Duration  4 weeks    PT Treatment/Interventions  ADLs/Self Care Home Management;Aquatic Therapy;Cryotherapy;Electrical Stimulation;Moist Heat;Ultrasound;Gait training;Stair training;Functional mobility training;Therapeutic activities;Therapeutic exercise;Balance training;Neuromuscular re-education;Patient/family education;Orthotic Fit/Training;Manual techniques;Passive range of motion;Energy conservation;Taping    PT Next Visit Plan  address HEP; discuss brace options    PT Home Exercise Plan  initiated    Consulted and Agree with Plan of Care  Patient       Patient will benefit from skilled therapeutic intervention in order to improve the following deficits and impairments:  Abnormal gait, Cardiopulmonary status limiting activity, Decreased activity tolerance, Decreased balance, Decreased endurance, Decreased mobility, Decreased range of motion, Decreased safety awareness, Decreased strength, Difficulty walking, Impaired flexibility, Obesity  Visit Diagnosis: 1. Chronic pain of left knee   2. Muscle weakness (generalized)   3. Difficulty in walking, not elsewhere classified        Problem List Patient Active Problem List   Diagnosis Date Noted  . Rosacea 01/11/2018  . Eczema 01/11/2018  . 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 Malfunction of spinal cord stimulator  (HCC) 03/09/2016  . Impaired glucose tolerance 02/04/2014  . Leg pain, diffuse 01/08/2014  . Chronic tension-type headache, not intractable 01/08/2014  . Gastroesophageal reflux disease without esophagitis 09/11/2013  . Other chronic cystitis without hematuria  07/17/2013  . Incomplete emptying of bladder 07/17/2013  . Increased frequency of urination 07/17/2013    Keatyn Jawad PT, DPT 11/12/2018, 2:35 PM  [place] MAIN [facility] SERVICES [address] [place], [place], [address] Phone: [phone]   Fax:  [phone]  Name: Rebekah Garrett MRN: [id number] Date of Birth: 11-27-1970

## 2018-11-19 ENCOUNTER — Ambulatory Visit: Payer: BLUE CROSS/BLUE SHIELD | Admitting: Physical Therapy

## 2018-11-19 ENCOUNTER — Encounter: Payer: Self-pay | Admitting: Physical Therapy

## 2018-11-19 ENCOUNTER — Other Ambulatory Visit: Payer: Self-pay

## 2018-11-19 DIAGNOSIS — M25562 Pain in left knee: Secondary | ICD-10-CM | POA: Diagnosis not present

## 2018-11-19 DIAGNOSIS — G8929 Other chronic pain: Secondary | ICD-10-CM

## 2018-11-19 DIAGNOSIS — M6281 Muscle weakness (generalized): Secondary | ICD-10-CM

## 2018-11-19 DIAGNOSIS — R262 Difficulty in walking, not elsewhere classified: Secondary | ICD-10-CM

## 2018-11-19 NOTE — Therapy (Signed)
[place] MAIN [facility] SERVICES [address] [place], [place], [address] Phone: [phone]   Fax:  [phone]  Physical Therapy Treatment/Discharge Summary  Patient Details  Name: Rebekah Garrett MRN: [id number] Date of Birth: 05-14-1970 Referring Provider (PT): Dr. Sheilah Pigeon MD   Encounter Date: 11/19/2018  PT End of Session - 11/19/18 0857    Visit Number  5    Number of Visits  5    Date for PT Re-Evaluation  11/27/18    PT Start Time  0849    PT Stop Time  0930    PT Time Calculation (min)  41 min    Activity Tolerance  Patient tolerated treatment well;No increased pain    Behavior During Therapy  WFL for tasks assessed/performed       Past Medical History:  Diagnosis Date  . Chronic tension headache   . Eczema   . GERD (gastroesophageal reflux disease)   . Hypertension   . Leg pain   . Seasonal allergies     Past Surgical History:  Procedure Laterality Date  . BLADDER SURGERY    . TUBAL LIGATION      There were no vitals filed for this visit.  Subjective Assessment - 11/19/18 0856    Subjective  Patient reports doing well; She reports being able to do HEP well without difficulty; Denies any soreness today;    Pertinent History  48 yo Female reports chronic left knee pain since 2015. 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 She reports that her BMI needs to be less than 40 to qualify; She has already been cleared by hematology and orthopedics but is waiting on weight loss; She reports in addition she has been having swelling in feet and legs which she states has been better; She is currently walking independently; She reports knee pain is worse with activity but is not bothersome when  resting; She has had multiple injections which has helped some but is just temporary relief;    Limitations  Standing;Walking    How long can you sit comfortably?  NA    How long can you stand comfortably?  30+ min    How long can you walk comfortably?  1-2 blocks    Diagnostic tests  knee arthritis noted on left side per recent X-rays; no other abnormality noted;    Patient Stated Goals  "strengthen body and lose fat"    Currently in Pain?  No/denies    Pain Onset  More than a month ago    Multiple Pain Sites  No         [facility] PT Assessment - 11/19/18 0001      Observation/Other Assessments   Lower Extremity Functional Scale   45/80 (The lower the number the greater the disability, improved from 32/80 on 10/30/18)      AROM   Overall AROM Comments  flexion: LLE: 115 degrees, extension: 0 degrees on LLE      Strength   Overall Strength Comments  BLE grossly 5/5, LLE hip abduction 5/5, hip extensionR: 4+/5, L: 4+/5      Ambulation/Gait   Gait Comments  able to negotiate steps reciprocally with intermittent rail assist without gait deviations; Denies any increase in  pain with stair negotiation      Standardized Balance Assessment   Five times sit to stand comments   11.8 sec without HHA (>10 sec indicates high fall risk, improved from 12.98 sec)       TREATMENT: Warm up on Treadmill 1.7 mph, 2% incline with 2 HHA x3 min (unbilled); Standing calf stretch heel off treadmill 20 sec hold x2 reps bilaterally;  Standing calf/hamstring stretch 20 sec hold x2 bilaterally;  Side lunge with Shoulder flexion 3# x10 each direction Patient required min verbal cues for correct exercise techniqueincluding cues to keep knee in proper position avoiding bending knee over toes and also to avoid excessive knee flexion for less knee discomfort;   Forward lunges with bicep curl x10 reps each LE, patient exhibits good UE/LE control and balance;   Long sitting hamstring stretch 20 sec hold x 2  reps bilaterally with min VCs for correct positioning for optimum flexibility;   Leg press: BLE plate 115# [id number] with min VCs to slow down eccentric return for better motor control and strengthening;  BLE leg press calf press 115# 2x15 with min VCs for proper positioning to isolate calf strengthening;   Patient prone: Alternate hamstring curl x15 reps bilaterally; Gluteal max hip extension (knee flexed) x15 bilaterally; Patient required min-moderate verbal/tactile cues for correct exercise technique for optimum strengthening;  Educated patient in HEP with instruction on specific exercises to do each day for proper strengthening and to reduce delayed onset muscle soreness. Patient verbalized understanding. Provided written instruction for better adherence;  Also educated patient in postural strengthening: Standing BUE shoulder extension green tband x10 reps; Standing BUE low rows green tband x10 reps; Posterior shoulder rolls x10 reps; Chin tucks x5 reps; Patient required min VCs for proper positioning and to reduce shoulder elevation for less upper trap activation/straining;  Response to treatment: Patient tolerated well; She requires min VCs for correct exercise technique to improve positioning and avoid injury; Patient is independent in HEP and has extensive HEP for UE/LE strengthening. While she has not met all goals she has made progress towards goals. She is still wanting to pursue surgery for knee replacement. She is appropriate for discharge from PT at this time as she has extensive HEP for continued conditioning and flexibility/strengthening;                                       PT Education - 11/19/18 0856    Education Details  LE strengthening, HEP reinforced; progress towards goals;    Person(s) Educated  Patient    Methods  Explanation;Verbal cues    Comprehension  Verbalized understanding;Returned demonstration;Verbal cues  required;Need further instruction       PT Short Term Goals - 11/19/18 0858      PT SHORT TERM GOAL #1   Title  Patient will be adherent to HEP at least 3x a week to improve functional strength and balance for better safety at home.    Time  2    Period  Weeks    Status  Achieved    Target Date  11/13/18      PT SHORT TERM GOAL #2   Title  Patient will improve LLE knee AROM 0-120 degrees to improve functional ROM prior to surgery for better surgical outcomes and improve mobility;    Time  2    Period  Weeks    Status  Partially  Met    Target Date  11/13/18        PT Long Term Goals - 11/19/18 0858      PT LONG TERM GOAL #1   Title  Patient (< 54 years old) will complete five times sit to stand test in < 10 seconds indicating an increased LE strength and improved balance.    Time  4    Period  Weeks    Status  Partially Met    Target Date  11/27/18      PT LONG TERM GOAL #2   Title  Patient will be independent with ascend/descend 12 steps using single UE in step over step pattern without LOB and without increase in knee pain for better home mobility;    Time  4    Period  Weeks    Status  Achieved    Target Date  11/27/18      PT LONG TERM GOAL #3   Title  Patient will increase lower extremity functional scale to >50/80 to demonstrate improved functional mobility and increased tolerance with ADLs.    Time  4    Period  Weeks    Status  Partially Met    Target Date  11/27/18            Plan - 11/19/18 1914    Clinical Impression Statement  Patient motivated and participated well within session. She does exhibit improved ROM and strength in LLE. She also exhibits improved sit<>Stand ability and stair negotiation ability; While she is moving better, she has not fully reached all goals likely due to chronic knee pain. Patient is still wanting to pursue total knee replacement surgery for chronic left knee pain. She has extensive HEP for UE/LE strengthening and  flexibility exercise to improve outcomes after surgery; She is adherent with HEP and verbalizes/demonstrates understanding and proper exercise technique. She is appropriate for discharge from PT at this time in order to conserve PT visits for after surgery. Patient agreeable with plan of care;    Personal Factors and Comorbidities  Comorbidity 3+;Education;Time since onset of injury/illness/exacerbation    Comorbidities  osteoarthritis, fibromyalgia, HTN    Examination-Activity Limitations  Carry;Caring for Others;Lift;Locomotion Level;Squat;Stairs;Stand;Transfers    Examination-Participation Restrictions  Church;Cleaning;Community Activity;Shop;Volunteer;Yard Work    Stability/Clinical Decision Making  Stable/Uncomplicated    Rehab Potential  Fair    PT Frequency  Other (comment)   2x a week for first week and then1 x a week for 3 additional weeks   PT Duration  4 weeks    PT Treatment/Interventions  ADLs/Self Care Home Management;Aquatic Therapy;Cryotherapy;Electrical Stimulation;Moist Heat;Ultrasound;Gait training;Stair training;Functional mobility training;Therapeutic activities;Therapeutic exercise;Balance training;Neuromuscular re-education;Patient/family education;Orthotic Fit/Training;Manual techniques;Passive range of motion;Energy conservation;Taping    PT Next Visit Plan  address HEP; discuss brace options    PT Home Exercise Plan  initiated    Consulted and Agree with Plan of Care  Patient       Patient will benefit from skilled therapeutic intervention in order to improve the following deficits and impairments:  Abnormal gait, Cardiopulmonary status limiting activity, Decreased activity tolerance, Decreased balance, Decreased endurance, Decreased mobility, Decreased range of motion, Decreased safety awareness, Decreased strength, Difficulty walking, Impaired flexibility, Obesity  Visit Diagnosis: 1. Chronic pain of left knee   2. Muscle weakness (generalized)   3. Difficulty in  walking, not elsewhere classified        Problem List Patient Active Problem List   Diagnosis Date Noted  . Rosacea 01/11/2018  . Eczema 01/11/2018  .  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 Impaired glucose tolerance 02/04/2014  . Leg pain, diffuse 01/08/2014  . Chronic tension-type headache, not intractable 01/08/2014  . Gastroesophageal reflux disease without esophagitis 09/11/2013  . Other chronic cystitis without hematuria 07/17/2013  . Incomplete emptying of bladder 07/17/2013  . Increased frequency of urination 07/17/2013    , PT, DPT 11/19/2018, 9:23 AM  [place] MAIN [facility] SERVICES [address] [place], [place], [address] Phone: [phone]   Fax:  [phone]  Name: SOPHIAROSE EADES MRN: [id number] Date of Birth: [birth year]

## 2018-11-26 ENCOUNTER — Ambulatory Visit: Payer: BLUE CROSS/BLUE SHIELD

## 2018-12-03 ENCOUNTER — Ambulatory Visit: Payer: BLUE CROSS/BLUE SHIELD

## 2018-12-10 ENCOUNTER — Ambulatory Visit: Payer: BLUE CROSS/BLUE SHIELD

## 2018-12-17 ENCOUNTER — Ambulatory Visit: Payer: BLUE CROSS/BLUE SHIELD | Admitting: Physical Therapy

## 2018-12-24 ENCOUNTER — Ambulatory Visit: Payer: BLUE CROSS/BLUE SHIELD

## 2018-12-31 ENCOUNTER — Ambulatory Visit: Payer: BLUE CROSS/BLUE SHIELD

## 2019-01-07 ENCOUNTER — Ambulatory Visit: Payer: BLUE CROSS/BLUE SHIELD

## 2019-01-14 ENCOUNTER — Ambulatory Visit: Payer: BLUE CROSS/BLUE SHIELD

## 2019-01-21 ENCOUNTER — Ambulatory Visit: Payer: BLUE CROSS/BLUE SHIELD

## 2019-01-28 ENCOUNTER — Ambulatory Visit: Payer: BLUE CROSS/BLUE SHIELD

## 2019-02-27 ENCOUNTER — Ambulatory Visit: Payer: Self-pay | Admitting: Advanced Practice Midwife

## 2019-02-27 ENCOUNTER — Other Ambulatory Visit: Payer: Self-pay

## 2019-02-27 DIAGNOSIS — Z113 Encounter for screening for infections with a predominantly sexual mode of transmission: Secondary | ICD-10-CM

## 2019-02-27 DIAGNOSIS — I270 Primary pulmonary hypertension: Secondary | ICD-10-CM

## 2019-02-27 DIAGNOSIS — Z9851 Tubal ligation status: Secondary | ICD-10-CM

## 2019-02-27 DIAGNOSIS — E7800 Pure hypercholesterolemia, unspecified: Secondary | ICD-10-CM | POA: Insufficient documentation

## 2019-02-27 LAB — WET PREP FOR TRICH, YEAST, CLUE
Trichomonas Exam: NEGATIVE
Yeast Exam: NEGATIVE

## 2019-02-27 NOTE — Progress Notes (Signed)
Wet mount reviewed and is negative today. No treatment needed for wet mount per standing order and per E. Sciora, CNM verbal order. Pt received written rx for Valacyclovir 500mg  daily #30 with refills for a year written by Donnal Moat, CNM. Provider orders completed.Ronny Bacon, RN

## 2019-02-27 NOTE — Progress Notes (Signed)
STI clinic/screening visit  Subjective:  MENUCHA DICESARE is a 48 y.o. nonsmoker G2P2 female being seen today for an STI screening visit. The patient reports they do have symptoms.  Patient has the following medical conditions:   Patient Active Problem List   Diagnosis Date Noted  .  morbid obesity 250 lbs 02/27/2019  . Pulmonary hypertension, primary (HCC) 02/27/2019  . Hypercholesteremia 02/27/2019  . Rosacea 01/11/2018  . Eczema 01/11/2018  . 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 Malfunction of spinal cord stimulator (HCC) 03/09/2016  . Impaired glucose tolerance 02/04/2014  . Leg pain, diffuse 01/08/2014  . Chronic tension-type headache, not intractable 01/08/2014  . Gastroesophageal reflux disease without esophagitis 09/11/2013  . Other chronic cystitis without hematuria 07/17/2013  . Incomplete emptying of bladder 07/17/2013  . Increased frequency of urination 07/17/2013     No chief complaint on file.   HPI  Patient reports external itching x 2 mo with increased white d/c.  LMP 02/11/19.  Last sex 2 years ago.  Last ETOH 2 wks ago (1 Margarita)1x/mo BTL 2002  See flowsheet for further details and programmatic requirements.    The following portions of the patient's history were reviewed and updated as appropriate: allergies, current medications, past medical history, past social history, past surgical history and problem list.  Objective:  There were no vitals filed for this visit.  Physical Exam Vitals signs and nursing note reviewed.  Constitutional:      Appearance: Normal appearance.  HENT:     Head: Normocephalic and atraumatic.     Mouth/Throat:     Mouth: Mucous membranes are moist.     Pharynx: Oropharynx is clear. No oropharyngeal exudate or posterior oropharyngeal erythema.  Pulmonary:     Effort: Pulmonary effort is normal.  Abdominal:     General: Abdomen is flat.     Palpations: There is no mass.     Tenderness: There is no  abdominal tenderness. There is no rebound.     Comments: Poor tone, soft without tenderness, increased adipose  Genitourinary:    General: Normal vulva.     Exam position: Lithotomy position.     Pubic Area: No rash or pubic lice.      Labia:        Right: No rash or lesion.        Left: No rash or lesion.      Vagina: Vaginal discharge (white creamy leukorrhea, ph>4.5) present. No erythema, bleeding or lesions.     Cervix: No cervical motion tenderness, discharge, friability, lesion or erythema.     Uterus: Normal.      Adnexa: Right adnexa normal and left adnexa normal.     Rectum: Normal.  Lymphadenopathy:     Head:     Right side of head: No preauricular or posterior auricular adenopathy.     Left side of head: No preauricular or posterior auricular adenopathy.     Cervical: No cervical adenopathy.     Upper Body:     Right upper body: No supraclavicular or axillary adenopathy.     Left upper body: No supraclavicular or axillary adenopathy.     Lower Body: No right inguinal adenopathy. No left inguinal adenopathy.  Skin:    General: Skin is warm and dry.     Findings: No rash.  Neurological:     Mental Status: She is alert and oriented to person, place, and time.       Assessment and Plan:  SKARLET LYONS is a 48 y.o. female presenting to the [facility] Department for STI screening  1.  morbid obesity 250 lbs   2. Pulmonary hypertension, primary ([place])   3. Hypercholesteremia On Lipitor  4. Screening examination for venereal disease Treat wet mount per standing orders Immunization nurse consult - WET PREP FOR [place], YEAST, CLUE - Syphilis Serology, [facility] - HIV [facility] - Chlamydia/Gonorrhea [facility]     Return if symptoms worsen or fail to improve.  No future appointments.  Herbie Saxon, CNM

## 2019-06-05 ENCOUNTER — Encounter: Payer: Self-pay | Admitting: Physician Assistant

## 2019-06-05 ENCOUNTER — Other Ambulatory Visit: Payer: Self-pay

## 2019-06-05 ENCOUNTER — Ambulatory Visit: Payer: Self-pay | Admitting: Physician Assistant

## 2019-06-05 DIAGNOSIS — Z113 Encounter for screening for infections with a predominantly sexual mode of transmission: Secondary | ICD-10-CM

## 2019-06-05 DIAGNOSIS — N760 Acute vaginitis: Secondary | ICD-10-CM

## 2019-06-05 DIAGNOSIS — B9689 Other specified bacterial agents as the cause of diseases classified elsewhere: Secondary | ICD-10-CM

## 2019-06-05 LAB — WET PREP FOR TRICH, YEAST, CLUE
Trichomonas Exam: NEGATIVE
Yeast Exam: NEGATIVE

## 2019-06-05 MED ORDER — METRONIDAZOLE 500 MG PO TABS: 500.0000 mg | ORAL_TABLET | Freq: Two times a day (BID) | ORAL | 0 refills | Status: AC

## 2019-06-05 NOTE — Progress Notes (Signed)
[facility] Department STI clinic/screening visit  Subjective:  Rebekah Garrett is a 49 y.o. female being seen today for an STI screening visit. The patient reports they do have symptoms.  Patient reports that they do not desire a pregnancy in the next year.   They reported they are not interested in discussing contraception today.  No LMP recorded. (Menstrual status: Other).   Patient has the following medical conditions:   Patient Active Problem List   Diagnosis Date Noted  .  morbid obesity 250 lbs 02/27/2019  . Pulmonary hypertension, primary ([place]) 02/27/2019  . Hypercholesteremia 02/27/2019  . H/O tubal ligation 2002 02/27/2019  . Rosacea 01/11/2018  . Eczema 01/11/2018  . 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 Impaired glucose tolerance 02/04/2014  . Leg pain, diffuse 01/08/2014  . Chronic tension-type headache, not intractable 01/08/2014  . Gastroesophageal reflux disease without esophagitis 09/11/2013  . Other chronic cystitis without hematuria 07/17/2013  . Incomplete emptying of bladder 07/17/2013  . Increased frequency of urination 07/17/2013    Chief Complaint  Patient presents with  . SEXUALLY TRANSMITTED DISEASE    HPI  Patient reports that she had discharge and itching for 1.5 weeks and that it has mostly resolved but she wanted to get checked anyway.  Denies other symptoms.  Periods have been irregular for about 2 months due to added stressors.  Has had BTL.  See flowsheet for further details and programmatic requirements.    The following portions of the patient's history were reviewed and updated as appropriate: allergies, current medications, past medical history, past social history, past surgical history and problem list.  Objective:  There were no vitals filed for this visit.  Physical Exam Constitutional:      General: She is not in acute distress.    Appearance:  Normal appearance. She is obese.  HENT:     Head: Normocephalic and atraumatic.     Comments: No nits, lice, or hair loss. No cervical, supraclavicular or axillary adenopathy.    Mouth/Throat:     Mouth: Mucous membranes are moist.     Pharynx: Oropharynx is clear. No oropharyngeal exudate or posterior oropharyngeal erythema.  Eyes:     Conjunctiva/sclera: Conjunctivae normal.  Pulmonary:     Effort: Pulmonary effort is normal.  Abdominal:     Palpations: Abdomen is soft. There is no mass.     Tenderness: There is no abdominal tenderness. There is no guarding or rebound.  Genitourinary:    General: Normal vulva.     Rectum: Normal.     Comments: External genitalia/pubic area without nits, lice, edema, erythema, lesions and inguinal adenopathy. Vagina with normal mucosa and small amount of dark menstrual bleeding. Cervix without visible lesions. Uterus firm, mobile, nt, no masses, no CMT, no adnexal tenderness or fullness. Musculoskeletal:     Cervical back: Neck supple. No tenderness.  Skin:    General: Skin is warm and dry.     Findings: No bruising, erythema, lesion or rash.  Neurological:     Mental Status: She is alert and oriented to person, place, and time.  Psychiatric:        Mood and Affect: Mood normal.        Behavior: Behavior normal.        Thought Content: Thought content normal.        Judgment: Judgment normal.      Assessment and Plan:  Rebekah Garrett is  a 49 y.o. female presenting to the [facility] Department for STI screening  1. Screening for STD (sexually transmitted disease) Patient into clinic with symptoms. Rec condoms with all sex. Await test results.  Counseled that RN will call if needs to RTC for further treatment once results are back.  - WET PREP FOR [place], YEAST, CLUE - Chlamydia/Gonorrhea [facility] - HIV [facility] - Syphilis Serology, [facility]  2. BV (bacterial vaginosis) Will treat BV with Metronidazole  500mg  #14 1 po BID for 7 days with food, no EtOH for 24 hr before and until 72 hr after completing medicine. No sex for 7 days. Rec using OTC antifungal cream if has itching during or just after completing antibiotic. - metroNIDAZOLE (FLAGYL) 500 MG tablet; Take 1 tablet (500 mg total) by mouth 2 (two) times daily for 7 days.  Dispense: 14 tablet; Refill: 0     No follow-ups on file.  No future appointments.  , PA
